# Patient Record
Sex: Female | Born: 1950 | Race: White | Hispanic: No | Smoking: Heavy tobacco smoker
Health system: Southern US, Community
[De-identification: ages and names within clinical notes are randomized; demographics above are authoritative.]

## PROBLEM LIST (undated history)

## (undated) DIAGNOSIS — M549 Dorsalgia, unspecified: Secondary | ICD-10-CM

## (undated) DIAGNOSIS — I10 Essential (primary) hypertension: Secondary | ICD-10-CM

## (undated) DIAGNOSIS — C44311 Basal cell carcinoma of skin of nose: Secondary | ICD-10-CM

## (undated) DIAGNOSIS — I739 Peripheral vascular disease, unspecified: Secondary | ICD-10-CM

## (undated) DIAGNOSIS — I2510 Atherosclerotic heart disease of native coronary artery without angina pectoris: Secondary | ICD-10-CM

## (undated) DIAGNOSIS — E119 Type 2 diabetes mellitus without complications: Secondary | ICD-10-CM

## (undated) HISTORY — PX: OTHER SURGICAL HISTORY: SHX169

---

## 1999-05-31 HISTORY — PX: OTHER SURGICAL HISTORY: SHX169

## 2002-05-30 DIAGNOSIS — C44311 Basal cell carcinoma of skin of nose: Secondary | ICD-10-CM

## 2002-05-30 HISTORY — DX: Basal cell carcinoma of skin of nose: C44.311

## 2002-05-30 HISTORY — PX: OTHER SURGICAL HISTORY: SHX169

## 2016-12-27 ENCOUNTER — Encounter (HOSPITAL_COMMUNITY): Payer: Self-pay | Admitting: Emergency Medicine

## 2016-12-27 ENCOUNTER — Inpatient Hospital Stay (HOSPITAL_COMMUNITY)
Admission: EM | Admit: 2016-12-27 | Discharge: 2016-12-30 | DRG: 246 | Disposition: A | Discharge status: Home / Self Care | Payer: Medicaid Other | Attending: Internal Medicine | Admitting: Internal Medicine

## 2016-12-27 ENCOUNTER — Emergency Department (HOSPITAL_COMMUNITY): Payer: Medicaid Other

## 2016-12-27 DIAGNOSIS — Z951 Presence of aortocoronary bypass graft: Secondary | ICD-10-CM

## 2016-12-27 DIAGNOSIS — R0602 Shortness of breath: Secondary | ICD-10-CM | POA: Diagnosis present

## 2016-12-27 DIAGNOSIS — I10 Essential (primary) hypertension: Secondary | ICD-10-CM | POA: Diagnosis present

## 2016-12-27 DIAGNOSIS — I5021 Acute systolic (congestive) heart failure: Secondary | ICD-10-CM | POA: Diagnosis present

## 2016-12-27 DIAGNOSIS — I739 Peripheral vascular disease, unspecified: Secondary | ICD-10-CM | POA: Diagnosis not present

## 2016-12-27 DIAGNOSIS — I509 Heart failure, unspecified: Secondary | ICD-10-CM | POA: Diagnosis not present

## 2016-12-27 DIAGNOSIS — I2510 Atherosclerotic heart disease of native coronary artery without angina pectoris: Secondary | ICD-10-CM | POA: Diagnosis present

## 2016-12-27 DIAGNOSIS — W268XXA Contact with other sharp object(s), not elsewhere classified, initial encounter: Secondary | ICD-10-CM | POA: Diagnosis not present

## 2016-12-27 DIAGNOSIS — Z6829 Body mass index (BMI) 29.0-29.9, adult: Secondary | ICD-10-CM | POA: Diagnosis not present

## 2016-12-27 DIAGNOSIS — Z66 Do not resuscitate: Secondary | ICD-10-CM | POA: Diagnosis present

## 2016-12-27 DIAGNOSIS — E1151 Type 2 diabetes mellitus with diabetic peripheral angiopathy without gangrene: Secondary | ICD-10-CM | POA: Diagnosis present

## 2016-12-27 DIAGNOSIS — E785 Hyperlipidemia, unspecified: Secondary | ICD-10-CM | POA: Diagnosis present

## 2016-12-27 DIAGNOSIS — Z85828 Personal history of other malignant neoplasm of skin: Secondary | ICD-10-CM | POA: Diagnosis not present

## 2016-12-27 DIAGNOSIS — I255 Ischemic cardiomyopathy: Secondary | ICD-10-CM | POA: Diagnosis present

## 2016-12-27 DIAGNOSIS — I25810 Atherosclerosis of coronary artery bypass graft(s) without angina pectoris: Secondary | ICD-10-CM | POA: Diagnosis not present

## 2016-12-27 DIAGNOSIS — I5020 Unspecified systolic (congestive) heart failure: Secondary | ICD-10-CM | POA: Diagnosis not present

## 2016-12-27 DIAGNOSIS — Z955 Presence of coronary angioplasty implant and graft: Secondary | ICD-10-CM | POA: Diagnosis not present

## 2016-12-27 DIAGNOSIS — I214 Non-ST elevation (NSTEMI) myocardial infarction: Principal | ICD-10-CM | POA: Diagnosis present

## 2016-12-27 DIAGNOSIS — R748 Abnormal levels of other serum enzymes: Secondary | ICD-10-CM | POA: Diagnosis not present

## 2016-12-27 DIAGNOSIS — I340 Nonrheumatic mitral (valve) insufficiency: Secondary | ICD-10-CM | POA: Diagnosis not present

## 2016-12-27 DIAGNOSIS — E1169 Type 2 diabetes mellitus with other specified complication: Secondary | ICD-10-CM | POA: Diagnosis present

## 2016-12-27 DIAGNOSIS — Z888 Allergy status to other drugs, medicaments and biological substances status: Secondary | ICD-10-CM | POA: Diagnosis not present

## 2016-12-27 DIAGNOSIS — E669 Obesity, unspecified: Secondary | ICD-10-CM | POA: Diagnosis present

## 2016-12-27 DIAGNOSIS — I5023 Acute on chronic systolic (congestive) heart failure: Secondary | ICD-10-CM | POA: Diagnosis not present

## 2016-12-27 DIAGNOSIS — S61411A Laceration without foreign body of right hand, initial encounter: Secondary | ICD-10-CM | POA: Diagnosis present

## 2016-12-27 DIAGNOSIS — F17200 Nicotine dependence, unspecified, uncomplicated: Secondary | ICD-10-CM | POA: Diagnosis not present

## 2016-12-27 DIAGNOSIS — F17210 Nicotine dependence, cigarettes, uncomplicated: Secondary | ICD-10-CM | POA: Diagnosis present

## 2016-12-27 DIAGNOSIS — R778 Other specified abnormalities of plasma proteins: Secondary | ICD-10-CM

## 2016-12-27 DIAGNOSIS — I5022 Chronic systolic (congestive) heart failure: Secondary | ICD-10-CM | POA: Diagnosis not present

## 2016-12-27 DIAGNOSIS — I110 Hypertensive heart disease with heart failure: Secondary | ICD-10-CM | POA: Diagnosis present

## 2016-12-27 DIAGNOSIS — R7989 Other specified abnormal findings of blood chemistry: Secondary | ICD-10-CM

## 2016-12-27 DIAGNOSIS — Z23 Encounter for immunization: Secondary | ICD-10-CM | POA: Diagnosis not present

## 2016-12-27 DIAGNOSIS — R0600 Dyspnea, unspecified: Secondary | ICD-10-CM | POA: Diagnosis not present

## 2016-12-27 HISTORY — DX: Essential (primary) hypertension: I10

## 2016-12-27 HISTORY — DX: Peripheral vascular disease, unspecified: I73.9

## 2016-12-27 HISTORY — DX: Atherosclerotic heart disease of native coronary artery without angina pectoris: I25.10

## 2016-12-27 HISTORY — DX: Type 2 diabetes mellitus without complications: E11.9

## 2016-12-27 HISTORY — DX: Basal cell carcinoma of skin of nose: C44.311

## 2016-12-27 HISTORY — DX: Dorsalgia, unspecified: M54.9

## 2016-12-27 LAB — CBC WITH DIFFERENTIAL/PLATELET
BASOS ABS: 0 10*3/uL (ref 0.0–0.1)
Basophils Relative: 1 %
Eosinophils Absolute: 0.4 10*3/uL (ref 0.0–0.7)
Eosinophils Relative: 5 %
HEMATOCRIT: 38.5 % (ref 36.0–46.0)
Hemoglobin: 12.7 g/dL (ref 12.0–15.0)
LYMPHS PCT: 29 %
Lymphs Abs: 2.4 10*3/uL (ref 0.7–4.0)
MCH: 29.8 pg (ref 26.0–34.0)
MCHC: 33 g/dL (ref 30.0–36.0)
MCV: 90.4 fL (ref 78.0–100.0)
Monocytes Absolute: 0.7 10*3/uL (ref 0.1–1.0)
Monocytes Relative: 8 %
NEUTROS ABS: 4.7 10*3/uL (ref 1.7–7.7)
Neutrophils Relative %: 57 %
PLATELETS: 247 10*3/uL (ref 150–400)
RBC: 4.26 MIL/uL (ref 3.87–5.11)
RDW: 15.5 % (ref 11.5–15.5)
WBC: 8.2 10*3/uL (ref 4.0–10.5)

## 2016-12-27 LAB — TROPONIN I: Troponin I: 0.55 ng/mL (ref <0.03)

## 2016-12-27 LAB — COMPREHENSIVE METABOLIC PANEL
ALBUMIN: 3.4 g/dL — AB (ref 3.5–5.0)
ALT: 9 U/L — ABNORMAL LOW (ref 14–54)
ANION GAP: 9 (ref 5–15)
AST: 13 U/L — AB (ref 15–41)
Alkaline Phosphatase: 73 U/L (ref 38–126)
BUN: 15 mg/dL (ref 6–20)
CHLORIDE: 111 mmol/L (ref 101–111)
CO2: 20 mmol/L — ABNORMAL LOW (ref 22–32)
Calcium: 9.5 mg/dL (ref 8.9–10.3)
Creatinine, Ser: 0.76 mg/dL (ref 0.44–1.00)
GFR calc Af Amer: >60 mL/min (ref >60)
GFR calc non Af Amer: >60 mL/min (ref >60)
GLUCOSE: 134 mg/dL — AB (ref 65–99)
POTASSIUM: 3.9 mmol/L (ref 3.5–5.1)
Sodium: 140 mmol/L (ref 135–145)
Total Bilirubin: 0.5 mg/dL (ref 0.3–1.2)
Total Protein: 7.1 g/dL (ref 6.5–8.1)

## 2016-12-27 LAB — GLUCOSE, CAPILLARY: GLUCOSE-CAPILLARY: 151 mg/dL — AB (ref 65–99)

## 2016-12-27 LAB — TSH: TSH: 1.311 u[IU]/mL (ref 0.350–4.500)

## 2016-12-27 LAB — BRAIN NATRIURETIC PEPTIDE: B NATRIURETIC PEPTIDE 5: 1937 pg/mL — AB (ref 0.0–100.0)

## 2016-12-27 MED ORDER — LIDOCAINE HCL (PF) 1 % IJ SOLN
INTRAMUSCULAR | Status: AC
  Filled 2016-12-27: qty 5

## 2016-12-27 MED ORDER — SODIUM CHLORIDE 0.9% FLUSH
3.0000 mL | Freq: Two times a day (BID) | INTRAVENOUS | Status: DC
  Administered 2016-12-27 – 2016-12-30 (×5): 3 mL via INTRAVENOUS

## 2016-12-27 MED ORDER — FUROSEMIDE 10 MG/ML IJ SOLN: 20.0000 mg | Freq: Two times a day (BID) | INTRAMUSCULAR | Status: DC

## 2016-12-27 MED ORDER — ENOXAPARIN SODIUM 40 MG/0.4ML ~~LOC~~ SOLN
40.0000 mg | SUBCUTANEOUS | Status: DC
  Administered 2016-12-27: 40 mg via SUBCUTANEOUS
  Filled 2016-12-27: qty 0.4

## 2016-12-27 MED ORDER — ACETAMINOPHEN 325 MG PO TABS
650.0000 mg | ORAL_TABLET | ORAL | Status: DC | PRN
Start: 2016-12-27 — End: 2016-12-30
  Administered 2016-12-29: 650 mg via ORAL
  Filled 2016-12-27: qty 2

## 2016-12-27 MED ORDER — CLOPIDOGREL BISULFATE 75 MG PO TABS
75.0000 mg | ORAL_TABLET | Freq: Every day | ORAL | Status: DC
  Administered 2016-12-27 – 2016-12-28 (×2): 75 mg via ORAL
  Filled 2016-12-27 (×2): qty 1

## 2016-12-27 MED ORDER — TRAMADOL HCL 50 MG PO TABS: 50.0000 mg | ORAL_TABLET | Freq: Four times a day (QID) | ORAL | Status: DC | PRN

## 2016-12-27 MED ORDER — NICOTINE 14 MG/24HR TD PT24
14.0000 mg | MEDICATED_PATCH | Freq: Every day | TRANSDERMAL | Status: DC
  Administered 2016-12-27 – 2016-12-30 (×3): 14 mg via TRANSDERMAL
  Filled 2016-12-27 (×3): qty 1

## 2016-12-27 MED ORDER — SODIUM CHLORIDE 0.9 % IV SOLN: 250.0000 mL | INTRAVENOUS | Status: DC | PRN

## 2016-12-27 MED ORDER — ASPIRIN EC 81 MG PO TBEC
81.0000 mg | DELAYED_RELEASE_TABLET | Freq: Every day | ORAL | Status: DC
  Administered 2016-12-27 – 2016-12-28 (×2): 81 mg via ORAL
  Filled 2016-12-27 (×2): qty 1

## 2016-12-27 MED ORDER — CARVEDILOL 3.125 MG PO TABS
3.1250 mg | ORAL_TABLET | Freq: Two times a day (BID) | ORAL | Status: DC
  Administered 2016-12-28 – 2016-12-30 (×5): 3.125 mg via ORAL
  Filled 2016-12-27 (×5): qty 1

## 2016-12-27 MED ORDER — LISINOPRIL 5 MG PO TABS
5.0000 mg | ORAL_TABLET | Freq: Every day | ORAL | Status: DC
  Administered 2016-12-28 – 2016-12-30 (×2): 5 mg via ORAL
  Filled 2016-12-27 (×2): qty 1

## 2016-12-27 MED ORDER — SODIUM CHLORIDE 0.9% FLUSH: 3.0000 mL | INTRAVENOUS | Status: DC | PRN

## 2016-12-27 MED ORDER — ATORVASTATIN CALCIUM 40 MG PO TABS
40.0000 mg | ORAL_TABLET | Freq: Every day | ORAL | Status: DC
  Administered 2016-12-28 – 2016-12-29 (×2): 40 mg via ORAL
  Filled 2016-12-27 (×2): qty 1

## 2016-12-27 MED ORDER — INSULIN ASPART 100 UNIT/ML ~~LOC~~ SOLN
0.0000 [IU] | Freq: Three times a day (TID) | SUBCUTANEOUS | Status: DC
  Administered 2016-12-28: 2 [IU] via SUBCUTANEOUS
  Administered 2016-12-28 – 2016-12-29 (×2): 3 [IU] via SUBCUTANEOUS
  Administered 2016-12-29 – 2016-12-30 (×2): 2 [IU] via SUBCUTANEOUS

## 2016-12-27 MED ORDER — NITROGLYCERIN 2 % TD OINT
1.0000 [in_us] | TOPICAL_OINTMENT | Freq: Once | TRANSDERMAL | Status: AC
  Administered 2016-12-27: 1 [in_us] via TOPICAL
  Filled 2016-12-27: qty 1

## 2016-12-27 MED ORDER — ASPIRIN EC 81 MG PO TBEC
81.0000 mg | DELAYED_RELEASE_TABLET | Freq: Every day | ORAL | Status: DC
  Administered 2016-12-30: 81 mg via ORAL
  Filled 2016-12-27: qty 1

## 2016-12-27 MED ORDER — POVIDONE-IODINE 10 % EX SOLN
CUTANEOUS | Status: AC
  Filled 2016-12-27: qty 30

## 2016-12-27 MED ORDER — ONDANSETRON HCL 4 MG/2ML IJ SOLN: 4.0000 mg | Freq: Four times a day (QID) | INTRAMUSCULAR | Status: DC | PRN

## 2016-12-27 MED ORDER — INSULIN ASPART 100 UNIT/ML ~~LOC~~ SOLN: 0.0000 [IU] | Freq: Every day | SUBCUTANEOUS | Status: DC

## 2016-12-27 MED ORDER — TETANUS-DIPHTH-ACELL PERTUSSIS 5-2.5-18.5 LF-MCG/0.5 IM SUSP
0.5000 mL | Freq: Once | INTRAMUSCULAR | Status: AC
  Administered 2016-12-27: 0.5 mL via INTRAMUSCULAR
  Filled 2016-12-27: qty 0.5

## 2016-12-27 MED ORDER — ALBUTEROL SULFATE (2.5 MG/3ML) 0.083% IN NEBU
5.0000 mg | INHALATION_SOLUTION | Freq: Once | RESPIRATORY_TRACT | Status: AC
  Administered 2016-12-27: 5 mg via RESPIRATORY_TRACT
  Filled 2016-12-27: qty 6

## 2016-12-27 MED ORDER — FUROSEMIDE 10 MG/ML IJ SOLN
20.0000 mg | Freq: Once | INTRAMUSCULAR | Status: AC
  Administered 2016-12-27: 20 mg via INTRAVENOUS
  Filled 2016-12-27: qty 2

## 2016-12-27 NOTE — ED Notes (Signed)
Critical Value: troponin  Value 0.55  Dr Roderic Palau informed

## 2016-12-27 NOTE — ED Triage Notes (Signed)
Patient states she cut her right had when opening a can today. States that she has been short of breath for 2 weeks and has not been able to sleep.

## 2016-12-27 NOTE — ED Provider Notes (Signed)
[facility] DEPT Provider Note   CSN: [id number] Arrival date & time: 12/27/16  1811     History   Chief Complaint Chief Complaint  Patient presents with  . Laceration  . Shortness of Breath    HPI Tamara Carpenter is a 66 y.o. female.  Patient states that she's been getting short of breath for the last couple weeks worse when she lies down flat. She states she wakes up in the middle the night short of breath. Patient also cut her right hand accidentally on a can of beans.   The history is provided by the patient.  Shortness of Breath  This is a recurrent problem. The problem occurs frequently.The current episode started more than 1 week ago. The problem has not changed since onset.Pertinent negatives include no fever, no headaches, no cough, no chest pain, no abdominal pain and no rash. It is unknown what precipitated the problem. Risk factors include recent prolonged sitting. She has tried nothing for the symptoms. The treatment provided no relief. She has had prior hospitalizations. She has had prior ED visits. She has had no prior ICU admissions. Associated medical issues do not include asthma.    Past Medical History:  Diagnosis Date  . Diabetes mellitus without complication ([place])   . Hypertension     Patient Active Problem List   Diagnosis Date Noted  . CHF exacerbation ([place]) 12/27/2016    Past Surgical History:  Procedure Laterality Date  . cardiac bypass      OB History    No data available       Home Medications    Prior to Admission medications   Not on File    Family History No family history on file.  Social History Social History  Substance Use Topics  . Smoking status: Heavy Tobacco Smoker    Packs/day: 1.00    Types: Cigarettes  . Smokeless tobacco: Never Used  . Alcohol use No     Allergies   Nitrofuran derivatives   Review of Systems Review of Systems  Constitutional: Negative for appetite change, fatigue and fever.  HENT:  Negative for congestion, ear discharge and sinus pressure.   Eyes: Negative for discharge.  Respiratory: Positive for shortness of breath. Negative for cough.   Cardiovascular: Negative for chest pain.  Gastrointestinal: Negative for abdominal pain and diarrhea.  Genitourinary: Negative for frequency and hematuria.  Musculoskeletal: Negative for back pain.       Hand laceration  Skin: Negative for rash.  Neurological: Negative for seizures and headaches.  Psychiatric/Behavioral: Negative for hallucinations.     Physical Exam Updated Vital Signs BP 140/76 (BP Location: Left Arm)   Pulse 93   Temp 97.8 F (36.6 C) (Oral)   Resp (!) 25   Ht 5\' 7"  (1.702 m)   Wt 86.2 kg (190 lb)   SpO2 94%   BMI 29.76 kg/m   Physical Exam  Constitutional: She is oriented to person, place, and time. She appears well-developed.  HENT:  Head: Normocephalic.  Eyes: Conjunctivae and EOM are normal. No scleral icterus.  Neck: Neck supple. No thyromegaly present.  Cardiovascular: Normal rate and regular rhythm.  Exam reveals no gallop and no friction rub.   No murmur heard. Pulmonary/Chest: No stridor. She has no wheezes. She has no rales. She exhibits no tenderness.  Abdominal: She exhibits no distension. There is no tenderness. There is no rebound.  Musculoskeletal: Normal range of motion. She exhibits edema.  3 cm laceration to the thenar eminence.  Laceration superficial  Lymphadenopathy:    She has no cervical adenopathy.  Neurological: She is oriented to person, place, and time. She exhibits normal muscle tone. Coordination normal.  Skin: No rash noted. No erythema.  Psychiatric: She has a normal mood and affect. Her behavior is normal.     ED Treatments / Results  Labs (all labs ordered are listed, but only abnormal results are displayed) Labs Reviewed  COMPREHENSIVE METABOLIC PANEL - Abnormal; Notable for the following:       Result Value   CO2 20 (*)    Glucose, Bld 134 (*)     Albumin 3.4 (*)    AST 13 (*)    ALT 9 (*)    All other components within normal limits  BRAIN NATRIURETIC PEPTIDE - Abnormal; Notable for the following:    B Natriuretic Peptide 1,937.0 (*)    All other components within normal limits  TROPONIN I - Abnormal; Notable for the following:    Troponin I 0.55 (*)    All other components within normal limits  CBC WITH DIFFERENTIAL/PLATELET    EKG  EKG Interpretation  Date/Time:  Tuesday December 27 2016 19:06:28 EDT Ventricular Rate:  96 PR Interval:    QRS Duration: 124 QT Interval:  378 QTC Calculation: 478 R Axis:   133 Text Interpretation:  Sinus rhythm Right bundle branch block Anterior infarct, old Confirmed by Milton Ferguson [phone]) on 12/27/2016 8:21:35 PM       Radiology Dg Chest 2 View  Result Date: 12/27/2016 CLINICAL DATA:  Short of breath for 2 weeks.  Productive cough. EXAM: CHEST  2 VIEW COMPARISON:  None. FINDINGS: Moderate cardiomegaly. Vascular congestion. Central basilar interstitial edema. Small pleural effusions. Increased AP diameter of the chest with kyphosis. There are mild wedge compression deformities involving T6, T7, T8, and T9. There is osteopenia in these have a chronic appearance. Postoperative changes from CABG and sternotomy are noted. IMPRESSION: CHF. Multiple mid-level thoracic compression deformities as described. Electronically Signed   By: Marybelle Killings M.D.   On: 12/27/2016 19:35    Procedures .Marland KitchenLaceration Repair Date/Time: 12/27/2016 9:13 PM Performed by: Milton Ferguson Authorized by: Milton Ferguson   Comments:     Patient with 3 cm superficial laceration to right hand. Area was cleaned thoroughly with Betadine and sterile water. Lidocaine without epi was used to anesthetize the area. 5 sutures were used to close laceration. There were 5-0 sutures.  Patient tolerated procedure well.   (including critical care time)  Medications Ordered in ED Medications  lidocaine (PF) (XYLOCAINE) 1 %  injection (not administered)  povidone-iodine (BETADINE) 10 % external solution (not administered)  furosemide (LASIX) injection 20 mg (not administered)  Tdap (BOOSTRIX) injection 0.5 mL (not administered)  nitroGLYCERIN (NITROGLYN) 2 % ointment 1 inch (not administered)  albuterol (PROVENTIL) (2.5 MG/3ML) 0.083% nebulizer solution 5 mg (5 mg Nebulization Given 12/27/16 1904)     Initial Impression / Assessment and Plan / ED Course  I have reviewed the triage vital signs and the nursing notes.  Pertinent labs & imaging results that were available during my care of the patient were reviewed by me and considered in my medical decision making (see chart for details).     Patient with congestive heart failure she will be admitted and diuresed. Patient also has laceration to her hand that has been sutured the sutures need to be taken out in 10-14 days Final Clinical Impressions(s) / ED Diagnoses   Final diagnoses:  Systolic congestive heart  failure, unspecified HF chronicity ([place])    New Prescriptions New Prescriptions   No medications on file     Milton Ferguson, MD 12/27/16 2114

## 2016-12-27 NOTE — ED Notes (Signed)
Daughter: Tamara Carpenter  [phone]  Please call for any change or of any information is needed

## 2016-12-27 NOTE — ED Notes (Signed)
Report to Heather, RN.

## 2016-12-27 NOTE — H&P (Signed)
History and Physical    Tamara Carpenter EGB:[id number] DOB: 1950/11/14 DOA: 12/27/2016  PCP: [facility]:  Dossie Der - cardiology in [place] (transferring here) Patient coming from: [place] from [place], moved here in October and moving care over; home - lives with sister; [place]: sister  Chief Complaint: hand laceration and SOB  HPI: Tamara Carpenter is a 66 y.o. 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 1- Tonight, she cut her hand while she was cooking (laceration).  This was sutured in the ER and she was given a tetanus shot. 2- For the last 2 weeks, she has SOB.  Tries to go to sleep and wakes back up SOB.  Has difficulty going to sleep.  Occasional SOB with ambulation.  The only way she can sleep (and only for a couple of hours then) is to sit up.  She sleeps sitting upright on a love seat with a pillow behind her.  +PND.  +LE edema, but she thinks that is from her diabetes.  +cough - productive of grayish white phlegm.  Some wheezing.  No chest pain.  She denies h/o CHF.  Her last echo was about 2 years ago in [place] (no records available but they have been requested).  She moved here from [place] in October and has been to [facility] once in May, by her report.  She was taking an array of medications (FeSO4; Metformin 1000 BID; Atenolol 50 BID; Plavix 75; Pletal 100 BID; Quinapril 20 BID; Pravastatin 80 qhs; Levemir 29 units daily; NTG prn; Tramadol 1 mg q6h; and 81 mg ASA) prior to moving and currently is only taking a couple of medications.   ED Course: 3 cm right hand laceration sutured; CHF - diuresis and admission  Review of Systems: As per HPI; otherwise review of systems reviewed and negative.   Ambulatory Status:  Ambulates without assistance  Past Medical History:  Diagnosis Date    . Back pain   . 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 CAD (coronary artery disease)   . Diabetes mellitus without complication ([place])   . Hypertension   . PVD (peripheral vascular disease) ([place])    has stents in both legs    Past Surgical History:  Procedure Laterality Date  . cardiac bypass  2001   3v  . multiple toe amputations  2010-2017   3 toes on the right foot, 1 on the left  . nose removal  2004   basal cell carcinoma    Social History   Social History  . Marital status: Single    Spouse name: N/A  . Number of children: N/A  . Years of education: N/A   Occupational History  . disabled    Social History Main Topics  . Smoking status: Heavy Tobacco Smoker    Packs/day: 1.00    Years: 48.00    Types: Cigarettes  . Smokeless tobacco: Never Used  . Alcohol use No  . Drug use: No  . Sexual activity: Not on file   Other Topics Concern  . Not on file   Social History Narrative  . No narrative on file    Allergies  Allergen Reactions  . Nitroglycerin     NOTE: Patient states that she can take the tablets  but is not able to use the patches    Family History  Problem Relation Age of Onset  . CAD Mother 11    Prior to Admission medications   Not on File    Physical Exam: Vitals:   12/27/16 1904 12/27/16 1930 12/27/16 1952 12/27/16 2132  BP:  140/76 140/76 (!) 143/77  Pulse:  91 93 99  Resp:  (!) 22 (!) 25 (!) 22  Temp:      TempSrc:      SpO2: 96% 96% 94% 97%  Weight:      Height:         General:  Appears calm and comfortable and is NAD; she mostly sits upright but does not otherwise appear uncomfortable Eyes:  PERRL, EOMI, normal lids, iris ENT:  grossly normal hearing, lips & tongue, mmm.  The outer 2/3 of her nose has been surgically removed, exposing the nasal septum which appears to have been covered with skin. Neck:  no LAD, masses or thyromegaly Cardiovascular:  RRR, no m/r/g. 2+ LE edema.   Respiratory:  Mildly decreased air movement in the bases without obvious crackles. Normal respiratory effort. Abdomen:  soft, ntnd, NABS Skin:  no rash or induration seen on limited exam.  + stasis. Musculoskeletal:  grossly normal tone BUE/BLE, good ROM, no bony abnormality.  S/p multiple amputations on the right foot (great toe, another middle toe, and the pinkie) and s/p amputation of the great toe on the left. Psychiatric:  grossly normal mood and affect, speech fluent and appropriate, AOx3 Neurologic:  CN 2-12 grossly intact, moves all extremities in coordinated fashion, sensation intact  Labs on Admission: I have personally reviewed following labs and imaging studies  CBC:  Recent Labs Lab 12/27/16 1901  WBC 8.2  NEUTROABS 4.7  HGB 12.7  HCT 38.5  MCV 90.4  PLT 935   Basic Metabolic Panel:  Recent Labs Lab 12/27/16 1901  NA 140  K 3.9  CL 111  CO2 20*  GLUCOSE 134*  BUN 15  CREATININE 0.76  CALCIUM 9.5   GFR: Estimated Creatinine Clearance: 80.1 mL/min (by C-G formula based on SCr of 0.76 mg/dL). Liver Function Tests:  Recent Labs Lab 12/27/16 1901  AST 13*  ALT 9*  ALKPHOS 73  BILITOT 0.5  PROT 7.1  ALBUMIN 3.4*   No results for input(s): LIPASE, AMYLASE in the last 168 hours. No results for input(s): AMMONIA in the last 168 hours. Coagulation Profile: No results for input(s): INR, PROTIME in the last 168 hours. Cardiac Enzymes:  Recent Labs Lab 12/27/16 1901  TROPONINI 0.55*   BNP (last 3 results) No results for input(s): PROBNP in the last 8760 hours. HbA1C: No results for input(s): HGBA1C in the last 72 hours. CBG: No results for input(s): GLUCAP in the last 168 hours. Lipid Profile: No results for input(s): CHOL, HDL, LDLCALC, TRIG, CHOLHDL, LDLDIRECT in the last 72 hours. Thyroid Function Tests: No results for input(s): TSH, T4TOTAL, FREET4, T3FREE, THYROIDAB in the last 72 hours. Anemia Panel: No results for input(s): VITAMINB12,  FOLATE, FERRITIN, TIBC, IRON, RETICCTPCT in the last 72 hours. Urine analysis: No results found for: COLORURINE, APPEARANCEUR, LABSPEC, PHURINE, GLUCOSEU, HGBUR, BILIRUBINUR, KETONESUR, PROTEINUR, UROBILINOGEN, NITRITE, LEUKOCYTESUR  Creatinine Clearance: Estimated Creatinine Clearance: 80.1 mL/min (by C-G formula based on SCr of 0.76 mg/dL).  Sepsis Labs: @LABRCNTIP (procalcitonin:4,lacticidven:4) )No results found for this or any previous visit (from the past 240 hour(s)).   Radiological Exams on Admission: Dg Chest 2 View  Result Date:  12/27/2016 CLINICAL DATA:  Short of breath for 2 weeks.  Productive cough. EXAM: CHEST  2 VIEW COMPARISON:  None. FINDINGS: Moderate cardiomegaly. Vascular congestion. Central basilar interstitial edema. Small pleural effusions. Increased AP diameter of the chest with kyphosis. There are mild wedge compression deformities involving T6, T7, T8, and T9. There is osteopenia in these have a chronic appearance. Postoperative changes from CABG and sternotomy are noted. IMPRESSION: CHF. Multiple mid-level thoracic compression deformities as described. Electronically Signed   By: Marybelle Killings M.D.   On: 12/27/2016 19:35    EKG: Independently reviewed.  NSR with rate 96; RBBB, nonspecific ST changes with no evidence of acute ischemia (initial EKG with significant artifact and so repeated)  Assessment/Plan Principal Problem:   CHF exacerbation (HCC) Active Problems:   NSTEMI (non-ST elevated myocardial infarction) ([place])   Diabetes mellitus type 2 in obese (HCC)   Tobacco dependence   Essential hypertension   S/P CABG x 3   PVD (peripheral vascular disease) (HCC)   CHF exacerbation -Patient with known ASCVD presenting with worsening SOB, orthopnea, and PND -Normal WBC count, no fever; very low suspicion for infectious cause and will not give antibiotics at this time -Significantly elevated BNP 1937 -Initial EKG suggested possible ST elevation but there was  significant artifact; repeat EKG is not overly concerning for acute ACS (despite elevated troponin, see below) -With elevated BNP and abnl CXR, new-onset CHF seems most likely diagnosis -Will admit with telemetry - she appears to need to re-establish care and has a complicated history that may take a few days to stabilize -Will request echocardiogram  -Will continue ASA and Plavix -Will start Lisinopril 5 mg daily (BP is normal to high and so should support addition of medication) -She was previously taking Atenolol while in [place]; will change to Coreg 3.25 mg BID -CHF order set utilized; may need CHF team consult but will hold until Echo results are available -Cardiology consult in AM -Was given Lasix 20 mg x 1 in ER and will repeat with 20 mg BID -She is not hypoxic but given her nasal resection, [place] O2 would not be an option for her -Normal kidney function at this time, will follow -Repeat EKG in AM  NSTEMI -Patient is without chest pain -She is in heart failure -Troponin 0.55 -Suspect that this is not a traditional MI associated with plaque rupture but rather her elevated troponin is secondary to demand ischemia in the setting of prolonged subacute heart failure -Will r/o with serial troponins although doubt ACS based on symptoms -If her troponin continues to rise and/or she develops chest pain, she will need overnight cardiology evaluation and initiation of treatment-dose Lovenox or heparin -If her troponin is flat overnight, it is reasonable to continue ASA and Plavix and await cardiology evaluation in the AM  S/p CABG, PVD with stents -She reports that 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 and so she would be unable to be resuscitated (and she wouldn't desire heroic measures anyway) -She also reports having stents in her legs, but additional information is not available at this time -She was previously on Pletal but does not appear to be taking this now -She is on ASA  and Plavix -She also is taking Pravastatin 80mg ; will substitute Lipitor 40 mg qhs for now and check FLP  DM -Glucose 134 -Hold Metformin -She was previously on Levemir but does not list this as a current home medication -Will cover with moderate-scale SSI for now -Check A1c -  She has a h/o multiple amputations, indicating long-standing suboptimal control  HTN -Currently does not appear to be on medications -Starting both low-dose ACE and Coreg now -Will follow  Tobacco dependence -Encourage cessation.  This was discussed with the patient and should be reviewed on an ongoing basis.   -She is pre-contemplative. -Patch ordered at patient request.   DVT prophylaxis:   Lovenox  Code Status: DNR - confirmed with patient Family Communication: None present Disposition Plan:  Home once clinically improved Consults called: Cardiology; CM/PT/OT/Nutrition  Admission status: Admit - It is my clinical opinion that admission to INPATIENT is reasonable and necessary because this patient will require at least 2 midnights in the hospital to treat this condition based on the medical complexity of the problems presented.  Given the aforementioned information, the predictability of an adverse outcome is felt to be significant.    Karmen Bongo MD [facility]  If 7PM-7AM, please contact night-coverage [email or web address] Password [facility]  12/27/2016, 9:52 PM

## 2016-12-27 NOTE — ED Notes (Signed)
Out of bed to BR 

## 2016-12-27 NOTE — ED Notes (Signed)
Dr Lorin Mercy has completed her eval-   Pt out of bed to BR

## 2016-12-27 NOTE — ED Notes (Addendum)
Bed is assigned

## 2016-12-27 NOTE — ED Notes (Signed)
From radiology 

## 2016-12-28 ENCOUNTER — Encounter (HOSPITAL_COMMUNITY): Payer: Self-pay

## 2016-12-28 ENCOUNTER — Inpatient Hospital Stay (HOSPITAL_COMMUNITY): Payer: Medicaid Other

## 2016-12-28 DIAGNOSIS — I509 Heart failure, unspecified: Secondary | ICD-10-CM

## 2016-12-28 DIAGNOSIS — R778 Other specified abnormalities of plasma proteins: Secondary | ICD-10-CM

## 2016-12-28 DIAGNOSIS — I340 Nonrheumatic mitral (valve) insufficiency: Secondary | ICD-10-CM

## 2016-12-28 DIAGNOSIS — R7989 Other specified abnormal findings of blood chemistry: Secondary | ICD-10-CM

## 2016-12-28 DIAGNOSIS — R0600 Dyspnea, unspecified: Secondary | ICD-10-CM

## 2016-12-28 DIAGNOSIS — R748 Abnormal levels of other serum enzymes: Secondary | ICD-10-CM

## 2016-12-28 LAB — ECHOCARDIOGRAM LIMITED
Height: 67 in
Weight: 3048 oz

## 2016-12-28 LAB — ECHOCARDIOGRAM COMPLETE
AVLVOTPG: 1 mmHg
CHL CUP MV DEC (S): 130
E decel time: 130 msec
E/e' ratio: 17.05
FS: 15 % — AB (ref 28–44)
Height: 67 in
IVS/LV PW RATIO, ED: 0.99
LA ID, A-P, ES: 45 mm
LA vol index: 50.4 mL/m2
LADIAMINDEX: 2.27 cm/m2
LAVOL: 99.7 mL
LAVOLA4C: 109 mL
LEFT ATRIUM END SYS DIAM: 45 mm
LV E/e' medial: 17.05
LV PW d: 15 mm — AB (ref 0.6–1.1)
LV SIMPSON'S DISK: 25
LV TDI E'MEDIAL: 4.7
LV dias vol: 160 mL — AB (ref 46–106)
LV e' LATERAL: 6.57 cm/s
LVDIAVOLIN: 81 mL/m2
LVEEAVG: 17.05
LVOT SV: 56 mL
LVOT VTI: 12.4 cm
LVOT area: 4.52 cm2
LVOT peak vel: 60.2 cm/s
LVOTD: 24 mm
LVSYSVOL: 120 mL — AB
LVSYSVOLIN: 61 mL/m2
Lateral S' vel: 8.68 cm/s
MV Peak grad: 5 mmHg
MV pk A vel: 86.7 m/s
MVPKEVEL: 112 m/s
Stroke v: 40 ml
TAPSE: 15.5 mm
TDI e' lateral: 6.57
Weight: 3048 oz

## 2016-12-28 LAB — CBC WITH DIFFERENTIAL/PLATELET
BASOS ABS: 0 10*3/uL (ref 0.0–0.1)
BASOS PCT: 0 %
EOS ABS: 0.4 10*3/uL (ref 0.0–0.7)
Eosinophils Relative: 4 %
HEMATOCRIT: 39.6 % (ref 36.0–46.0)
HEMOGLOBIN: 13 g/dL (ref 12.0–15.0)
Lymphocytes Relative: 29 %
Lymphs Abs: 2.7 10*3/uL (ref 0.7–4.0)
MCH: 29.9 pg (ref 26.0–34.0)
MCHC: 32.8 g/dL (ref 30.0–36.0)
MCV: 91 fL (ref 78.0–100.0)
Monocytes Absolute: 0.8 10*3/uL (ref 0.1–1.0)
Monocytes Relative: 8 %
NEUTROS ABS: 5.6 10*3/uL (ref 1.7–7.7)
Neutrophils Relative %: 59 %
Platelets: 263 10*3/uL (ref 150–400)
RBC: 4.35 MIL/uL (ref 3.87–5.11)
RDW: 15.4 % (ref 11.5–15.5)
WBC: 9.5 10*3/uL (ref 4.0–10.5)

## 2016-12-28 LAB — BASIC METABOLIC PANEL
ANION GAP: 11 (ref 5–15)
BUN: 14 mg/dL (ref 6–20)
CHLORIDE: 107 mmol/L (ref 101–111)
CO2: 23 mmol/L (ref 22–32)
CREATININE: 0.86 mg/dL (ref 0.44–1.00)
Calcium: 9 mg/dL (ref 8.9–10.3)
Glucose, Bld: 174 mg/dL — ABNORMAL HIGH (ref 65–99)
Potassium: 3.7 mmol/L (ref 3.5–5.1)
SODIUM: 141 mmol/L (ref 135–145)

## 2016-12-28 LAB — TROPONIN I
Troponin I: 0.52 ng/mL (ref <0.03)
Troponin I: 0.54 ng/mL (ref <0.03)
Troponin I: 0.54 ng/mL (ref <0.03)

## 2016-12-28 LAB — LIPID PANEL
CHOLESTEROL: 181 mg/dL (ref 0–200)
HDL: 29 mg/dL — ABNORMAL LOW (ref >40)
LDL CALC: 105 mg/dL — AB (ref 0–99)
TRIGLYCERIDES: 237 mg/dL — AB (ref <150)
Total CHOL/HDL Ratio: 6.2 RATIO
VLDL: 47 mg/dL — ABNORMAL HIGH (ref 0–40)

## 2016-12-28 LAB — GLUCOSE, CAPILLARY
GLUCOSE-CAPILLARY: 174 mg/dL — AB (ref 65–99)
GLUCOSE-CAPILLARY: 140 mg/dL — AB (ref 65–99)
Glucose-Capillary: 101 mg/dL — ABNORMAL HIGH (ref 65–99)
Glucose-Capillary: 93 mg/dL (ref 65–99)

## 2016-12-28 LAB — PROTIME-INR
INR: 1.21
PROTHROMBIN TIME: 15.4 s — AB (ref 11.4–15.2)

## 2016-12-28 LAB — TSH: TSH: 1.353 u[IU]/mL (ref 0.350–4.500)

## 2016-12-28 LAB — HEPARIN LEVEL (UNFRACTIONATED): Heparin Unfractionated: 0.33 IU/mL (ref 0.30–0.70)

## 2016-12-28 MED ORDER — HEPARIN (PORCINE) IN NACL 100-0.45 UNIT/ML-% IJ SOLN
1000.0000 [IU]/h | INTRAMUSCULAR | Status: DC
  Administered 2016-12-28 – 2016-12-29 (×2): 1000 [IU]/h via INTRAVENOUS
  Filled 2016-12-28 (×2): qty 250

## 2016-12-28 MED ORDER — HEPARIN BOLUS VIA INFUSION
4000.0000 [IU] | Freq: Once | INTRAVENOUS | Status: AC
  Administered 2016-12-28: 4000 [IU] via INTRAVENOUS
  Filled 2016-12-28: qty 4000

## 2016-12-28 MED ORDER — POTASSIUM CHLORIDE CRYS ER 20 MEQ PO TBCR
20.0000 meq | EXTENDED_RELEASE_TABLET | Freq: Two times a day (BID) | ORAL | Status: DC
  Administered 2016-12-28 – 2016-12-30 (×4): 20 meq via ORAL
  Filled 2016-12-28 (×4): qty 1

## 2016-12-28 MED ORDER — SODIUM CHLORIDE 0.9% FLUSH: 3.0000 mL | Freq: Two times a day (BID) | INTRAVENOUS | Status: DC

## 2016-12-28 MED ORDER — PERFLUTREN LIPID MICROSPHERE
1.0000 mL | INTRAVENOUS | Status: AC | PRN
  Administered 2016-12-28: 2 mL via INTRAVENOUS
  Filled 2016-12-28: qty 10

## 2016-12-28 MED ORDER — FUROSEMIDE 10 MG/ML IJ SOLN
40.0000 mg | Freq: Two times a day (BID) | INTRAMUSCULAR | Status: DC
  Administered 2016-12-28 – 2016-12-30 (×4): 40 mg via INTRAVENOUS
  Filled 2016-12-28 (×4): qty 4

## 2016-12-28 MED ORDER — SODIUM CHLORIDE 0.9 % IV SOLN: 250.0000 mL | INTRAVENOUS | Status: DC | PRN

## 2016-12-28 MED ORDER — SODIUM CHLORIDE 0.9 % IV SOLN
INTRAVENOUS | Status: DC
  Administered 2016-12-29: 10 mL/h via INTRAVENOUS

## 2016-12-28 MED ORDER — ASPIRIN 81 MG PO CHEW
81.0000 mg | CHEWABLE_TABLET | ORAL | Status: AC
  Administered 2016-12-29: 81 mg via ORAL
  Filled 2016-12-28: qty 1

## 2016-12-28 MED ORDER — SODIUM CHLORIDE 0.9% FLUSH: 3.0000 mL | INTRAVENOUS | Status: DC | PRN

## 2016-12-28 NOTE — Progress Notes (Signed)
*  PRELIMINARY RESULTS* Echocardiogram 2D Echocardiogram limited with definity has been performed.  Leavy Cella 12/28/2016, 3:07 PM

## 2016-12-28 NOTE — Progress Notes (Addendum)
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  Her ECHO showed EF of 25-30 percent, and he was seen by cardiology and Tamara Carpenter felt that ICM needs to be r/out.   Assessment & Plan:   Principal Problem:   CHF exacerbation (Tamara Carpenter) Active Problems:   NSTEMI (non-ST elevated myocardial infarction) (Tamara Carpenter)   Diabetes mellitus type 2 in obese (Tamara Carpenter)   Tobacco dependence   Essential hypertension   S/P CABG x 3   PVD (peripheral vascular disease) ([place])   Acute heart failure (Tamara Carpenter)   Elevated troponin   1. CHF.  R/out ischemic CMP.  She has slight positve in the troponin, and was started on IV Heparin.  Per cardiology, she will be sent to Tamara Carpenter for cardiac cath.  Arrangement will be per cardiollogy.   2. Laceraton:  She was given dT, and had sutures.  Note she is on blood thinner. 3. DM:  Metformin was held.  She was given SSI. 4. Tobacco abuse:  Advised stop.    DVT prophylaxis:  IV Heparin. Code Status:  DNR confirmed per admitting physician.  Family Communication: None at bedside.   Consultants:   Cardiology.   Procedures:  None.  Antimicrobials: Anti-infectives    None       Subjective:  No complaints.     Objective: Vitals:   12/27/16 1952 12/27/16 2132 12/27/16 2247 12/28/16 0500  BP: 140/76 (!) 143/77 133/73   Pulse: 93 99 91   Resp: (!) 25 (!) 22 20   Temp:   97.8 F (36.6 C)   TempSrc:   Oral   SpO2: 94% 97% 98%   Weight:   86.7 kg (191 lb 1.6 oz) 86.4 kg (190 lb 8 oz)  Height:   5\' 7"  (1.702 m)     Intake/Output Summary (Last 24 hours) at 12/28/16  1451 Last data filed at 12/28/16 0500  Gross per 24 hour  Intake              225 ml  Output              900 ml  Net             -675 ml   Filed Weights   12/27/16 1833 12/27/16 2247 12/28/16 0500  Weight: 86.2 kg (190 lb) 86.7 kg (191 lb 1.6 oz) 86.4 kg (190 lb 8 oz)    Examination:  General exam: Appears calm and comfortable  Respiratory system: Clear to auscultation. Respiratory effort normal. Cardiovascular system: S1 & S2 heard, RRR. No JVD, murmurs, rubs, gallops or clicks. No pedal edema. Gastrointestinal system: Abdomen is nondistended, soft and nontender. No organomegaly or masses felt. Normal bowel sounds heard. Central nervous system: Alert and oriented. No focal neurological deficits. Extremities: Symmetric 5 x 5 power. Skin: No rashes, lesions or ulcers Psychiatry: Judgement and insight appear normal. Mood & affect appropriate.   Data Reviewed: I have personally reviewed following labs and imaging studies  CBC:  Recent Labs Lab 12/27/16 1901 12/27/16 2351  WBC 8.2 9.5  NEUTROABS 4.7 5.6  HGB 12.7 13.0  HCT 38.5 39.6  MCV 90.4 91.0  PLT 247 885   Basic Metabolic Panel:  Recent Labs Lab 12/27/16 1901 12/28/16 0629  NA 140 141  K 3.9 3.7  CL 111 107  CO2 20* 23  GLUCOSE 134* 174*  BUN 15 14  CREATININE 0.76 0.86  CALCIUM 9.5 9.0   GFR: Estimated Creatinine Clearance: 74.6 mL/min (by C-G formula based on SCr of 0.86 mg/dL). Liver Function Tests:  Recent Labs Lab 12/27/16 1901  AST 13*  ALT 9*  ALKPHOS 73  BILITOT 0.5  PROT 7.1  ALBUMIN 3.4*   Coagulation Profile:  Recent Labs Lab 12/28/16 1204  INR 1.21   Cardiac Enzymes:  Recent Labs Lab 12/27/16 1901 12/27/16 2351 12/28/16 0632 12/28/16 1204  TROPONINI 0.55* 0.52* 0.54* 0.54*   CBG:  Recent Labs Lab 12/27/16 2243 12/28/16 0753 12/28/16 1202  GLUCAP 151* 174* 101*   Lipid Profile:  Recent Labs  12/28/16 0632  CHOL 181  HDL 29*  LDLCALC 105*  TRIG 237*    CHOLHDL 6.2   Thyroid Function Tests:  Recent Labs  12/28/16 1231  TSH 1.353   Radiology Studies: Dg Chest 2 View  Result Date: 12/27/2016 CLINICAL DATA:  Short of breath for 2 weeks.  Productive cough. EXAM: CHEST  2 VIEW COMPARISON:  None. FINDINGS: Moderate cardiomegaly. Vascular congestion. Central basilar interstitial edema. Small pleural effusions. Increased AP diameter of the chest with kyphosis. There are mild wedge compression deformities involving T6, T7, T8, and T9. There is osteopenia in these have a chronic appearance. Postoperative changes from CABG and sternotomy are noted. IMPRESSION: CHF. Multiple mid-level thoracic compression deformities as described. Electronically Signed   By: Marybelle Killings M.D.   On: 12/27/2016 19:35    Scheduled Meds: . aspirin EC  81 mg Oral Daily  . aspirin EC  81 mg Oral Daily  . atorvastatin  40 mg Oral q1800  . carvedilol  3.125 mg Oral BID WC  . clopidogrel  75 mg Oral Daily  . furosemide  40 mg Intravenous Q12H  . insulin aspart  0-15 Units Subcutaneous TID WC  . insulin aspart  0-5 Units Subcutaneous QHS  . lisinopril  5 mg Oral Daily  . nicotine  14 mg Transdermal Daily  . potassium chloride  20 mEq Oral BID  . sodium chloride flush  3 mL Intravenous Q12H   Continuous Infusions: . sodium chloride    . heparin 1,000 Units/hr (12/28/16 1047)    LOS: 1 day   Arie Gable, MD FACP Hospitalist.  If 7PM-7AM, please contact night-coverage [email or web address] Password [email or web address] 12/28/2016, 2:51 PM

## 2016-12-28 NOTE — Progress Notes (Signed)
Pt states that she has periods of difficulty swallowing that has been occurring since her bypass surgery. MD made aware. Will continue to monitor.

## 2016-12-28 NOTE — Progress Notes (Signed)
CRITICAL VALUE ALERT  Critical Value: Troponin 0.52  Date & Time Notied:  12/28/16, 0100  Provider Notified: [place]  Orders Received/Actions taken: No new orders

## 2016-12-28 NOTE — Final Consult Note (Addendum)
Cardiology Consultation:   Patient ID: Jiovanna Frei; [id number]; Jun 19, 1950   Admit date: 12/27/2016 Date of Consult: 12/28/2016  Primary Care Provider: [facility] Primary Cardiologist:[place] but moved here and transferring care Primary Electrophysiologist:  none   Patient Profile:   Meital Riehl is a 66 y.o. female with a hx of CABG who is being seen today for the evaluation of CHF at the request of Dr. Lorin Mercy  History of Present Illness:   Ms. Bond is a 67 year old female patient with history of CAD status post MI and CABG 2001, hypertension, diabetes mellitus status post multiple toe amputations, PVD with leg stents 2013 and basal cell CA of the nose status post resection 2004. She has been followed in [place] for many years but recently moved to [place] for 9 months and now here living with her sister. She'd like to transfer care here. She had been out of all her meds for over 1 yr but was restarted on 4 of them in May by primary care. Hasn't seen cardiologist in over 2 yrs.  She came to the emergency room after cutting her hand while cooking that required suturing. She also complained of shortness of breath with orthopnea for couple weeks. She has occasional angina which she takes NTG for but only once every 6 months. Some leg edema.Smokes 10-12 cigs/day. Doesn't have history of CHF. She was found to be in CHF with a BNP over 1900. Troponins were elevated but flat at 0.55. EKG without acute ischemic changes.    Past Medical History:  Diagnosis Date  . Back pain   . 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 CAD (coronary artery disease)   . Diabetes mellitus without complication ([place])   . Hypertension   . PVD (peripheral vascular disease) ([place])    has stents in both legs    Past Surgical History:  Procedure Laterality Date  . cardiac bypass  2001   3v  . multiple toe amputations  2010-2017   3 toes on the  right foot, 1 on the left  . nose removal  2004   basal cell carcinoma     Inpatient Medications: Scheduled Meds: . aspirin EC  81 mg Oral Daily  . aspirin EC  81 mg Oral Daily  . atorvastatin  40 mg Oral q1800  . carvedilol  3.125 mg Oral BID WC  . clopidogrel  75 mg Oral Daily  . enoxaparin (LOVENOX) injection  40 mg Subcutaneous Q24H  . furosemide  20 mg Intravenous Q12H  . insulin aspart  0-15 Units Subcutaneous TID WC  . insulin aspart  0-5 Units Subcutaneous QHS  . lidocaine (PF)      . lisinopril  5 mg Oral Daily  . nicotine  14 mg Transdermal Daily  . povidone-iodine      . sodium chloride flush  3 mL Intravenous Q12H   Continuous Infusions: . sodium chloride     PRN Meds: sodium chloride, acetaminophen, ondansetron (ZOFRAN) IV, sodium chloride flush, traMADol  Allergies:    Allergies  Allergen Reactions  . Nitroglycerin     NOTE: Patient states that she can take the tablets but is not able to use the patches    Social History:   Social History   Social History  . Marital status: Single    Spouse name: N/A  . Number of children: N/A  . Years of education: N/A   Occupational  History  . disabled    Social History Main Topics  . Smoking status: Heavy Tobacco Smoker    Packs/day: 1.00    Years: 48.00    Types: Cigarettes  . Smokeless tobacco: Never Used  . Alcohol use No  . Drug use: No  . Sexual activity: Not on file   Other Topics Concern  . Not on file   Social History Narrative  . No narrative on file    Family History:   The patient's family history includes CAD (age of onset: 103) in her mother.  ROS:  Please see the history of present illness.  Review of Systems  Constitution: Positive for weakness.  HENT: Negative.   Eyes: Negative.   Cardiovascular: Positive for chest pain, dyspnea on exertion, leg swelling, orthopnea and paroxysmal nocturnal dyspnea.  Respiratory: Positive for shortness of breath, sleep disturbances due to  breathing and wheezing.   Hematologic/Lymphatic: Negative.   Skin: Positive for skin cancer.  Musculoskeletal: Positive for muscle weakness and myalgias. Negative for joint pain.  Gastrointestinal: Negative.   Genitourinary: Negative.      All other ROS reviewed and negative.     Physical Exam/Data:   Vitals:   12/27/16 1952 12/27/16 2132 12/27/16 2247 12/28/16 0500  BP: 140/76 (!) 143/77 133/73   Pulse: 93 99 91   Resp: (!) 25 (!) 22 20   Temp:   97.8 F (36.6 C)   TempSrc:   Oral   SpO2: 94% 97% 98%   Weight:   191 lb 1.6 oz (86.7 kg) 190 lb 8 oz (86.4 kg)  Height:   5\' 7"  (1.702 m)     Intake/Output Summary (Last 24 hours) at 12/28/16 0756 Last data filed at 12/28/16 0500  Gross per 24 hour  Intake              225 ml  Output              900 ml  Net             -675 ml   Filed Weights   12/27/16 1833 12/27/16 2247 12/28/16 0500  Weight: 190 lb (86.2 kg) 191 lb 1.6 oz (86.7 kg) 190 lb 8 oz (86.4 kg)   Body mass index is 29.84 kg/m.  General:  Well nourished, well developed, in no acute distress HEENT: most of nose removed for CA Lymph: no adenopathy Neck: no JVD Endocrine:  No thryomegaly Vascular: No carotid bruits; FA pulsesdecreased bilaterally without bruits  Cardiac:  normal S1, S2; RRR; S4, 1-2/6 sys murmur apex-distant HS Lungs:  Decreased breath sounds with diffuse wheezing, rhonchi and rales bilateral bases  Abd: soft, nontender, no hepatomegaly  Ext: several toe amputations with 1 plus edema Musculoskeletal: toe amputations BUE and BLE strength normal and equal Skin: warm and dry  Neuro:  CNs 2-12 intact, no focal abnormalities noted Psych:  Normal affect   EKG:  The EKG was personally reviewed and demonstrates:  Normal sinus rhythm with 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 Telemetry:  Telemetry was personally reviewed and demonstrates:  NSR  Relevant CV Studies:   Laboratory Data:  Chemistry Recent Labs Lab 12/27/16 1901  12/28/16 0629  NA 140 141  K 3.9 3.7  CL 111 107  CO2 20* 23  GLUCOSE 134* 174*  BUN 15 14  CREATININE 0.76 0.86  CALCIUM 9.5 9.0  GFRNONAA >60 >60  GFRAA >60 >60  ANIONGAP 9 11     Recent Labs  Lab 12/27/16 1901  PROT 7.1  ALBUMIN 3.4*  AST 13*  ALT 9*  ALKPHOS 73  BILITOT 0.5   Hematology Recent Labs Lab 12/27/16 1901 12/27/16 2351  WBC 8.2 9.5  RBC 4.26 4.35  HGB 12.7 13.0  HCT 38.5 39.6  MCV 90.4 91.0  MCH 29.8 29.9  MCHC 33.0 32.8  RDW 15.5 15.4  PLT 247 263   Cardiac Enzymes Recent Labs Lab 12/27/16 1901 12/27/16 2351 12/28/16 0632  TROPONINI 0.55* 0.52* 0.54*   No results for input(s): TROPIPOC in the last 168 hours.  BNP Recent Labs Lab 12/27/16 1908  BNP 1,937.0*    DDimer No results for input(s): DDIMER in the last 168 hours.  Radiology/Studies:  Dg Chest 2 View  Result Date: 12/27/2016 CLINICAL DATA:  Short of breath for 2 weeks.  Productive cough. EXAM: CHEST  2 VIEW COMPARISON:  None. FINDINGS: Moderate cardiomegaly. Vascular congestion. Central basilar interstitial edema. Small pleural effusions. Increased AP diameter of the chest with kyphosis. There are mild wedge compression deformities involving T6, T7, T8, and T9. There is osteopenia in these have a chronic appearance. Postoperative changes from CABG and sternotomy are noted. IMPRESSION: CHF. Multiple mid-level thoracic compression deformities as described. Electronically Signed   By: Marybelle Killings M.D.   On: 12/27/2016 19:35    Assessment and Plan:   Acute CHF question systolic versus diastolic BNP 4967. Records requested from [facility] and echo here pending. Out of meds for over 1 yr, 4 restarted in May-Pravastatin, Plavix, metformin and ultram. On Lasix 20 mg IV BID. Will increase to 40 mg IV BID.Add potassium. Normal renal function. Follow. Negative 675 cc overnight.  CAD status post MI & CABG 2001-some angina but overall stable- EKG old ant MI, Troponins flat  PVD status post  leg stents [facility] 2013  Hypertension coreg and lisinopril added  Diabetes mellitus on metformin as outpatient-insulin here.   Sumner Boast, PA-C  12/28/2016 7:56 AM   Attending Note Patient seen and discussed with PA [place] Public, I agree with her documentation above. 66 yo female with prior care in [place], records are not available at this time. She reportedly has a remote history of prior CABG, HTN, DM2, PAD with prior interventions admitted with SOB, DOE, and orthopnea.    Hgb 12.7, Plt 247, K 3.9, Cr 0.76, TSH 1.3, BNP 1937,  Trop 0.55-->0.52-->0.54 CXR pulmonary edema EKG SR, RBBB, RPFB, inferior and lateral TWIs  66 yo female presents with signs and symptoms of CHF. Previously followed by Dr Charlett Nose in [place], [place]. Little is known about her prior cardiac history, including prior LV function. I have placed an order to request records She reports a CABG in 2001, denies any cardiac cardiac stents or caths since that time. She is unaware of any prior history of CHF. Reports several week history of progressiong SOB, DOE, and orthopnea. Has noted some LE edema. No chest pain. She reports her breathing has never been this bad  Patient presents with signs and symptoms of acute congestive heart failure. Prior cardiac function is unknown at this time, echo is pending today. Her troponin though fairly flat is fairly elevated to attribute to CHF alone, concern would be ischemia as possible cause of her acute CHF. Medical therapy with aspirin, atorva 40, coreg 3.125mg  bd, plavix 75, lisinopril 5. We will start heparin gtt. I have asked our echo technicians to perform her echo this morning. I would anticipate likely transfer for LHC/RHC pending echo results. Recent hand  laceration that received stitches, will need to monitor while on heparin. Continue IV lasix.    Carlyle Dolly MD  Addendum 1230pm Echo shows LVEF 25-30%, this is some limitation in visualization and I think her LVEF and  wall motion could better be described with limited contrast study. She also has restrictivie diastolic function. Unknown prior cardiac function but she denies any known history of heart failure and has not had a cath since her 2001 CABG. Drop in LVEF and elevated troponin concerning for ICM. We will plan for transfer to [facility], transfer today and procedure for tomorrow. Continue current medical therapy   Carlyle Dolly MD

## 2016-12-28 NOTE — Progress Notes (Signed)
Report called and given to RN on [place].

## 2016-12-28 NOTE — Progress Notes (Signed)
OT Screen Note  Patient Details Name: Tamara Carpenter MRN: [id number] DOB: 1951/02/23   Cancelled Treatment:    Reason Eval/Treat Not Completed: OT screened, no needs identified, will sign off. Pt is currently functioning at Independent level and is safe to return to home when medically ready without any additional OT services.     Ailene Ravel, OTR/L,CBIS  [phone] 12/28/2016, 8:51 AM

## 2016-12-28 NOTE — Progress Notes (Signed)
EKG completed and placed on pts chart.  

## 2016-12-28 NOTE — Progress Notes (Signed)
*  PRELIMINARY RESULTS* Echocardiogram 2D Echocardiogram has been performed.  Samuel Germany 12/28/2016, 12:01 PM

## 2016-12-28 NOTE — Plan of Care (Signed)
Problem: Activity: Goal: Capacity to carry out activities will improve Outcome: Progressing Verbalized understanding  Problem: Cardiac: Goal: Ability to achieve and maintain adequate cardiopulmonary perfusion will improve Outcome: Progressing Verbalized understanding  Problem: Education: Goal: Ability to demonstrate managment of disease process will improve Outcome: Progressing Verbalized understanding Goal: Ability to verbalize understanding of medication therapies will improve Outcome: Progressing Verbalized understanding  Problem: Health Behavior/Discharge Planning: Goal: Ability to manage health-related needs will improve for discharge Outcome: Progressing Verbalized understanding

## 2016-12-28 NOTE — Progress Notes (Signed)
PT Cancellation Note  Patient Details Name: Tamara Carpenter MRN: [id number] DOB: 1950-06-11   Cancelled Treatment:    Reason Eval/Treat Not Completed: Patient not medically ready. Chart reviewed, RN consulted. Per cardiology note:   "Drop in LVEF and elevated troponin concerning for ICM. We will plan for transfer to [facility], transfer today and procedure for tomorrow."  I will DC PT order at this time and await new order once pt returns from [facility] and is medical ready for OOB assessment.  1:01 PM, 12/28/16 Etta Grandchild, PT, DPT Physical Therapist - [place] [phone] [facility])  [phone] (Office)    Jerral Mccauley C 12/28/2016, 12:59 PM

## 2016-12-28 NOTE — Plan of Care (Signed)
Problem: Safety: Goal: Ability to remain free from injury will improve Outcome: Progressing Verbalized understanding

## 2016-12-28 NOTE — Progress Notes (Signed)
Nutrition Brief Note  RD consulted due to dx of HF and to screen patient for any diet related questions.   Pt reports that she only eats 1 meal day. She tries to make all her meals "from scratch". However, her meals sound to be very light such as a bowl of cereal of a PBJ sandwich. She sounds to eat relatively little.   Denies eating fast food, frozen meals, hotdogs, lunch meats or canned soup with any consistency.   She does not use the salt shaker and uses Mrs. Dash salt substitute.   She says she had recently spoken to a dietician at [facility] regarding a low sodium diet.   No diet/education needs detected.   Burtis Junes RD, LDN, CNSC Clinical Nutrition Pager: [phone] 12/28/2016 12:07 PM

## 2016-12-28 NOTE — Progress Notes (Addendum)
ANTICOAGULATION CONSULT NOTE - Initial Consult  Pharmacy Consult for Heparin Indication: chest pain/ACS  Allergies  Allergen Reactions  . Nitroglycerin     NOTE: Patient states that she can take the tablets but is not able to use the patches    Patient Measurements: Height: 5\' 7"  (170.2 cm) Weight: 190 lb 8 oz (86.4 kg) IBW/kg (Calculated) : 61.6 HEPARIN DW (KG): 79.9  Vital Signs: Temp: 97.8 F (36.6 C) (07/31 2247) Temp Source: Oral (07/31 2247) BP: 133/73 (07/31 2247) Pulse Rate: 91 (07/31 2247)  Labs:  Recent Labs  12/27/16 1901 12/27/16 2351 12/28/16 0629 12/28/16 0632  HGB 12.7 13.0  --   --   HCT 38.5 39.6  --   --   PLT 247 263  --   --   CREATININE 0.76  --  0.86  --   TROPONINI 0.55* 0.52*  --  0.54*    Estimated Creatinine Clearance: 74.6 mL/min (by C-G formula based on SCr of 0.86 mg/dL).   Medical History: Past Medical History:  Diagnosis Date  . Back pain   . 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 CAD (coronary artery disease)   . Diabetes mellitus without complication ([place])   . Hypertension   . PVD (peripheral vascular disease) ([place])    has stents in both legs    Medications:  Prescriptions Prior to Admission  Medication Sig Dispense Refill Last Dose  . aspirin EC 81 MG tablet Take 81 mg by mouth daily.   12/27/2016 at Unknown time  . Cholecalciferol (VITAMIN D3) 5000 units CAPS Take 1 capsule by mouth daily.   12/27/2016 at Unknown time  . clopidogrel (PLAVIX) 75 MG tablet Take 75 mg by mouth daily.   12/27/2016 at Unknown time  . metFORMIN (GLUCOPHAGE) 1000 MG tablet Take 1,000 mg by mouth 2 (two) times daily with a meal.   12/27/2016 at Unknown time  . nitroGLYCERIN (NITROSTAT) 0.4 MG SL tablet Place 0.4 mg under the tongue every 5 (five) minutes as needed for chest pain.   UNKNOWN  . pravastatin (PRAVACHOL) 80 MG tablet Take 80 mg by mouth at bedtime.   12/26/2016 at Unknown time  . traMADol (ULTRAM) 50 MG  tablet Take 50 mg by mouth every 6 (six) hours as needed for moderate pain or severe pain.   UNKNOWN at Unknown time    Assessment: 66 yo female presents with signs and symptoms of CHF. Little is known about her prior cardiac history. . Her troponin is fairly elevated to attribute to CHF alone, concern would be ischemia as the etiology of her CHF. Medical therapy with aspirin, atorva 40, coreg 3.125mg  bd, plavix 75, lisinopril 5. Plan is to start heparin infusion.  Goal of Therapy:  Heparin level 0.3-0.7 units/ml Monitor platelets by anticoagulation protocol: Yes   Plan:  Give 4000 units bolus x 1 Start heparin infusion at 1000 units/hr Check anti-Xa level in 6 hours and daily while on heparin Continue to monitor H&H and platelets  Isac Sarna, BS Vena [place], BCPS Clinical Pharmacist Pager [phone] 12/28/2016,10:24 AM  Addum:  Initial heparin level therapeutic.  Cont drip at 1000 units/hr.  F/u am labs

## 2016-12-29 ENCOUNTER — Encounter (HOSPITAL_COMMUNITY)
Admission: EM | Disposition: A | Discharge status: Home / Self Care | Payer: Self-pay | Source: Home / Self Care | Attending: Internal Medicine

## 2016-12-29 DIAGNOSIS — I5023 Acute on chronic systolic (congestive) heart failure: Secondary | ICD-10-CM

## 2016-12-29 DIAGNOSIS — E669 Obesity, unspecified: Secondary | ICD-10-CM

## 2016-12-29 DIAGNOSIS — I25810 Atherosclerosis of coronary artery bypass graft(s) without angina pectoris: Secondary | ICD-10-CM

## 2016-12-29 DIAGNOSIS — E1169 Type 2 diabetes mellitus with other specified complication: Secondary | ICD-10-CM

## 2016-12-29 DIAGNOSIS — I5021 Acute systolic (congestive) heart failure: Secondary | ICD-10-CM

## 2016-12-29 HISTORY — PX: CORONARY STENT INTERVENTION: CATH118234

## 2016-12-29 HISTORY — PX: RIGHT/LEFT HEART CATH AND CORONARY/GRAFT ANGIOGRAPHY: CATH118267

## 2016-12-29 LAB — GLUCOSE, CAPILLARY
GLUCOSE-CAPILLARY: 141 mg/dL — AB (ref 65–99)
GLUCOSE-CAPILLARY: 198 mg/dL — AB (ref 65–99)
Glucose-Capillary: 190 mg/dL — ABNORMAL HIGH (ref 65–99)
Glucose-Capillary: 170 mg/dL — ABNORMAL HIGH (ref 65–99)

## 2016-12-29 LAB — HEMOGLOBIN A1C
Hgb A1c MFr Bld: 7.2 % — ABNORMAL HIGH (ref 4.8–5.6)
MEAN PLASMA GLUCOSE: 160 mg/dL

## 2016-12-29 LAB — HIV ANTIBODY (ROUTINE TESTING W REFLEX): HIV SCREEN 4TH GENERATION: NONREACTIVE

## 2016-12-29 SURGERY — RIGHT/LEFT HEART CATH AND CORONARY/GRAFT ANGIOGRAPHY
Anesthesia: LOCAL

## 2016-12-29 MED ORDER — IOPAMIDOL (ISOVUE-370) INJECTION 76%
INTRAVENOUS | Status: DC | PRN
  Administered 2016-12-29: 160 mL via INTRA_ARTERIAL

## 2016-12-29 MED ORDER — LABETALOL HCL 5 MG/ML IV SOLN
10.0000 mg | INTRAVENOUS | Status: AC | PRN
  Administered 2016-12-29: 14:00:00 via INTRAVENOUS
  Filled 2016-12-29: qty 4

## 2016-12-29 MED ORDER — TICAGRELOR 90 MG PO TABS
90.0000 mg | ORAL_TABLET | Freq: Two times a day (BID) | ORAL | Status: DC
  Administered 2016-12-29 – 2016-12-30 (×2): 90 mg via ORAL
  Filled 2016-12-29 (×2): qty 1

## 2016-12-29 MED ORDER — HEPARIN (PORCINE) IN NACL 2-0.9 UNIT/ML-% IJ SOLN
INTRAMUSCULAR | Status: AC
  Filled 2016-12-29: qty 500

## 2016-12-29 MED ORDER — HYDRALAZINE HCL 20 MG/ML IJ SOLN: 5.0000 mg | INTRAMUSCULAR | Status: AC | PRN

## 2016-12-29 MED ORDER — BIVALIRUDIN TRIFLUOROACETATE 250 MG IV SOLR
INTRAVENOUS | Status: AC
  Filled 2016-12-29: qty 250

## 2016-12-29 MED ORDER — SODIUM CHLORIDE 0.9 % IV SOLN
INTRAVENOUS | Status: DC | PRN
  Administered 2016-12-29: 1.75 mg/kg/h via INTRAVENOUS

## 2016-12-29 MED ORDER — FENTANYL CITRATE (PF) 100 MCG/2ML IJ SOLN
INTRAMUSCULAR | Status: AC
  Filled 2016-12-29: qty 2

## 2016-12-29 MED ORDER — SODIUM CHLORIDE 0.9 % IV SOLN: INTRAVENOUS | Status: AC

## 2016-12-29 MED ORDER — HEPARIN (PORCINE) IN NACL 2-0.9 UNIT/ML-% IJ SOLN
INTRAMUSCULAR | Status: AC
  Filled 2016-12-29: qty 1000

## 2016-12-29 MED ORDER — SODIUM CHLORIDE 0.9% FLUSH: 3.0000 mL | INTRAVENOUS | Status: DC | PRN

## 2016-12-29 MED ORDER — LIDOCAINE HCL (PF) 1 % IJ SOLN
INTRAMUSCULAR | Status: AC
  Filled 2016-12-29: qty 30

## 2016-12-29 MED ORDER — ACTIVE PARTNERSHIP FOR HEALTH OF YOUR HEART BOOK
Freq: Once | Status: AC
  Administered 2016-12-29: 17:00:00
  Filled 2016-12-29: qty 1

## 2016-12-29 MED ORDER — ANGIOPLASTY BOOK
Freq: Once | Status: AC
  Administered 2016-12-29: 17:00:00
  Filled 2016-12-29: qty 1

## 2016-12-29 MED ORDER — SODIUM CHLORIDE 0.9 % IV SOLN: 250.0000 mL | INTRAVENOUS | Status: DC | PRN

## 2016-12-29 MED ORDER — TICAGRELOR 90 MG PO TABS
ORAL_TABLET | ORAL | Status: DC | PRN
  Administered 2016-12-29: 180 mg via ORAL

## 2016-12-29 MED ORDER — MIDAZOLAM HCL 2 MG/2ML IJ SOLN
INTRAMUSCULAR | Status: AC
  Filled 2016-12-29: qty 2

## 2016-12-29 MED ORDER — IOPAMIDOL (ISOVUE-370) INJECTION 76%
INTRAVENOUS | Status: AC
  Filled 2016-12-29: qty 125

## 2016-12-29 MED ORDER — TICAGRELOR 90 MG PO TABS
ORAL_TABLET | ORAL | Status: AC
  Filled 2016-12-29: qty 2

## 2016-12-29 MED ORDER — LIDOCAINE HCL (PF) 1 % IJ SOLN
INTRAMUSCULAR | Status: DC | PRN
  Administered 2016-12-29: 10 mL

## 2016-12-29 MED ORDER — IOPAMIDOL (ISOVUE-370) INJECTION 76%
INTRAVENOUS | Status: AC
  Filled 2016-12-29: qty 100

## 2016-12-29 MED ORDER — SODIUM CHLORIDE 0.9% FLUSH
3.0000 mL | Freq: Two times a day (BID) | INTRAVENOUS | Status: DC
  Administered 2016-12-29 – 2016-12-30 (×3): 3 mL via INTRAVENOUS

## 2016-12-29 MED ORDER — FENTANYL CITRATE (PF) 100 MCG/2ML IJ SOLN
INTRAMUSCULAR | Status: DC | PRN
  Administered 2016-12-29: 50 ug via INTRAVENOUS

## 2016-12-29 MED ORDER — HEPARIN (PORCINE) IN NACL 2-0.9 UNIT/ML-% IJ SOLN
INTRAMUSCULAR | Status: AC | PRN
  Administered 2016-12-29: 500 mL via INTRA_ARTERIAL

## 2016-12-29 MED ORDER — MIDAZOLAM HCL 2 MG/2ML IJ SOLN
INTRAMUSCULAR | Status: DC | PRN
  Administered 2016-12-29: 2 mg via INTRAVENOUS

## 2016-12-29 MED ORDER — BIVALIRUDIN BOLUS VIA INFUSION - CUPID
INTRAVENOUS | Status: DC | PRN
  Administered 2016-12-29: 63.375 mg via INTRAVENOUS

## 2016-12-29 MED ORDER — HEART ATTACK BOUNCING BOOK
Freq: Once | Status: AC
  Administered 2016-12-29: 23:00:00
  Filled 2016-12-29: qty 1

## 2016-12-29 SURGICAL SUPPLY — 20 items
CATH EXPO 5F MPA-1 (CATHETERS) ×2 IMPLANT
CATH INFINITI 5FR MULTPACK ANG (CATHETERS) ×2 IMPLANT
CATH LAUNCHER 6FR AR1 (CATHETERS) ×2 IMPLANT
CATH SWAN GANZ 7F STRAIGHT (CATHETERS) ×2 IMPLANT
CATHETER LAUNCHER 6FR LCB (CATHETERS) ×2 IMPLANT
DEVICE SPIDERFX EMB PROT 4MM (WIRE) ×2 IMPLANT
KIT ENCORE 26 ADVANTAGE (KITS) ×2 IMPLANT
KIT HEART LEFT (KITS) ×2 IMPLANT
KIT HEART RIGHT NAMIC (KITS) ×2 IMPLANT
PACK CARDIAC CATHETERIZATION (CUSTOM PROCEDURE TRAY) ×2 IMPLANT
SHEATH PINNACLE 5F 10CM (SHEATH) ×2 IMPLANT
SHEATH PINNACLE 6F 10CM (SHEATH) ×2 IMPLANT
SHEATH PINNACLE 7F 10CM (SHEATH) ×2 IMPLANT
STENT RESOLUTE ONYX 3.5X12 (Permanent Stent) ×2 IMPLANT
STENT RESOLUTE ONYX 3.5X18 (Permanent Stent) ×4 IMPLANT
TRANSDUCER W/STOPCOCK (MISCELLANEOUS) ×2 IMPLANT
TUBING CIL FLEX 10 FLL-RA (TUBING) ×2 IMPLANT
WIRE COUGAR XT STRL 190CM (WIRE) ×2 IMPLANT
WIRE EMERALD 3MM-J .025X260CM (WIRE) ×2 IMPLANT
WIRE EMERALD 3MM-J .035X150CM (WIRE) ×2 IMPLANT

## 2016-12-29 NOTE — Interval H&P Note (Signed)
History and Physical Interval Note:  12/29/2016 9:08 AM  Tamara Carpenter  has presented today for cardiac cath with the diagnosis of cardiomyopathy, CAD s/p bypass, elevated troponin. The various methods of treatment have been discussed with the patient and family. After consideration of risks, benefits and other options for treatment, the patient has consented to  Procedure(s): RIGHT/LEFT HEART CATH AND CORONARY/GRAFT ANGIOGRAPHY (N/A) as a surgical intervention .  The patient's history has been reviewed, patient examined, no change in status, stable for surgery.  I have reviewed the patient's chart and labs.  Questions were answered to the patient's satisfaction.    Cath Lab Visit (complete for each Cath Lab visit)  Clinical Evaluation Leading to the Procedure:   ACS: Yes.    Non-ACS:    Anginal Classification: CCS III  Anti-ischemic medical therapy: No Therapy  Non-Invasive Test Results: No non-invasive testing performed  Prior CABG: Previous CABG        Lauree Chandler

## 2016-12-29 NOTE — H&P (View-Only) (Signed)
See full consult note by Dr. Harl Bowie 12/28/16.  Pt transferred to [facility] for cardiac cath.  Admitted with CHF and mild troponin elevation.  Known CAD with prior 3V CABG in 2002.   No changes in exam or plan since yesterday.   Tamara Carpenter 12/29/2016 9:07 AM

## 2016-12-29 NOTE — Progress Notes (Addendum)
PROGRESS NOTE    Diania Co  NFA:[id number] DOB: 1950/12/27 DOA: 12/27/2016 PCP: [facility]    Brief Narrative:   Ms. Meditz is a 66 year old female patient with history of CAD status post MI and CABG 2001, hypertension, diabetes mellitus status post multiple toe amputations, PVD with leg stents 2013 and basal cell CA of the nose status post resection 2004, admitted July 31 by Dr Lorin Mercy for acute CHF, and hand laceration while cooking.  Her ECHO showed EF of 25-30 percent, and he was seen by cardiology and Dr Harl Bowie felt that ICM needs to be r/out.   Assessment & Plan:   Principal Problem:   CHF exacerbation ([place]) Active Problems:   NSTEMI (non-ST elevated myocardial infarction) ([place])   Diabetes mellitus type 2 in obese ([place])   Tobacco dependence   Essential hypertension   S/P CABG x 3   PVD (peripheral vascular disease) ([place])   Acute heart failure (HCC)   Elevated troponin   1. Acute systolic CHF.  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  She has slight positve in the troponin, and was started on IV Heparin.  Per cardiology, patient transferred to [facility] for cardiac cath.  Cardiac cath showed severe triple-vessel coronary artery disease, successful DES 2 SVG to OM, successful DES  SVG to RCA. Recommendation is for DAPT with ASA and Brilinta for one year. Continue statin and beta blocker. EF is 25-30% 2. [place]:  She was given dT, and had sutures.  3. DM:  Metformin was held.  She was given SSI. Hemoglobin A1c 7.2 4. Tobacco abuse:  Advised stop.  5. CAD-status post CABG 2001,  recommendations as above 6. Peripheral vascular disease, stable 7. Hypertension-continue Coreg and lisinopril   DVT prophylaxis:  IV Heparin. Code Status:  DNR confirmed per admitting physician.  Family Communication: None at bedside.  Disposition-likely discharge home tomorrow  Consultants:   Cardiology.   Procedures:  None.  Antimicrobials: Anti-infectives    None      Subjective:    Status post PCI, chest pain-free    Objective: Vitals:   12/29/16 1100 12/29/16 1105 12/29/16 1110 12/29/16 1115  BP: (!) 144/84 (!) 149/83 (!) 143/90   Pulse: 86 79 87 (!) 128  Resp: 10 13 12  (!) 25  Temp:      TempSrc:      SpO2: 92% (!) 88% 94% 95%  Weight:      Height:        Intake/Output Summary (Last 24 hours) at 12/29/16 1216 Last data filed at 12/29/16 0830  Gross per 24 hour  Intake           595.17 ml  Output              900 ml  Net          -304.83 ml   Filed Weights   12/28/16 0500 12/28/16 1853 12/29/16 0334  Weight: 86.4 kg (190 lb 8 oz) 86.2 kg (190 lb) 84.5 kg (186 lb 4.6 oz)    Examination:  General exam: Appears calm and comfortable  Respiratory system: Clear to auscultation. Respiratory effort normal. Cardiovascular system: S1 & S2 heard, RRR. No JVD, murmurs, rubs, gallops or clicks. No pedal edema. Gastrointestinal system: Abdomen is nondistended, soft and nontender. No organomegaly or masses felt. Normal bowel sounds heard. Central nervous system: Alert and oriented. No focal neurological deficits. Extremities: Symmetric 5 x 5 power. Skin: No rashes, lesions or ulcers Psychiatry: Judgement and insight appear normal. Mood &  affect appropriate.   Data Reviewed: I have personally reviewed following labs and imaging studies  CBC:  Recent Labs Lab 12/27/16 1901 12/27/16 2351  WBC 8.2 9.5  NEUTROABS 4.7 5.6  HGB 12.7 13.0  HCT 38.5 39.6  MCV 90.4 91.0  PLT 247 993   Basic Metabolic Panel:  Recent Labs Lab 12/27/16 1901 12/28/16 0629  NA 140 141  K 3.9 3.7  CL 111 107  CO2 20* 23  GLUCOSE 134* 174*  BUN 15 14  CREATININE 0.76 0.86  CALCIUM 9.5 9.0   GFR: Estimated Creatinine Clearance: 73.9 mL/min (by C-G formula based on SCr of 0.86 mg/dL). Liver Function Tests:  Recent Labs Lab 12/27/16 1901  AST 13*  ALT 9*  ALKPHOS 73  BILITOT 0.5  PROT 7.1  ALBUMIN 3.4*   Coagulation Profile:  Recent Labs Lab 12/28/16 1204    INR 1.21   Cardiac Enzymes:  Recent Labs Lab 12/27/16 1901 12/27/16 2351 12/28/16 0632 12/28/16 1204  TROPONINI 0.55* 0.52* 0.54* 0.54*   CBG:  Recent Labs Lab 12/28/16 1202 12/28/16 1700 12/28/16 1906 12/29/16 0755 12/29/16 1149  GLUCAP 101* 140* 93 141* 190*   Lipid Profile:  Recent Labs  12/28/16 0632  CHOL 181  HDL 29*  LDLCALC 105*  TRIG 237*  CHOLHDL 6.2   Thyroid Function Tests:  Recent Labs  12/28/16 1231  TSH 1.353   Radiology Studies: Dg Chest 2 View  Result Date: 12/27/2016 CLINICAL DATA:  Short of breath for 2 weeks.  Productive cough. EXAM: CHEST  2 VIEW COMPARISON:  None. FINDINGS: Moderate cardiomegaly. Vascular congestion. Central basilar interstitial edema. Small pleural effusions. Increased AP diameter of the chest with kyphosis. There are mild wedge compression deformities involving T6, T7, T8, and T9. There is osteopenia in these have a chronic appearance. Postoperative changes from CABG and sternotomy are noted. IMPRESSION: CHF. Multiple mid-level thoracic compression deformities as described. Electronically Signed   By: Marybelle Killings M.D.   On: 12/27/2016 19:35    Scheduled Meds: . aspirin EC  81 mg Oral Daily  . atorvastatin  40 mg Oral q1800  . carvedilol  3.125 mg Oral BID WC  . furosemide  40 mg Intravenous Q12H  . insulin aspart  0-15 Units Subcutaneous TID WC  . insulin aspart  0-5 Units Subcutaneous QHS  . lisinopril  5 mg Oral Daily  . nicotine  14 mg Transdermal Daily  . potassium chloride  20 mEq Oral BID  . sodium chloride flush  3 mL Intravenous Q12H  . sodium chloride flush  3 mL Intravenous Q12H  . ticagrelor  90 mg Oral BID   Continuous Infusions: . sodium chloride    . sodium chloride    . sodium chloride      LOS: 2 days   Reyne Dumas, MD   Hospitalist.  If 7PM-7AM, please contact night-coverage [email or web address] Password [email or web address] 12/29/2016, 12:16 PM

## 2016-12-29 NOTE — Progress Notes (Signed)
Pt left for a surgery at 0900am in a stable condition

## 2016-12-29 NOTE — Progress Notes (Addendum)
Sheath Pull Arterial and Venous Site right groin,  Site assessment prior to removal "0",  Manual pressure applied for 30 minutes.  Patient stable during procedure, VSS.  Post sheath groin assessment "0".  Patient instructed to apply direct pressure to site when coughing, laughing, or sneezing and to keep head on pillow until bed rest is completed.  Distal pulses +1 post pull.  Pt tolerated procedure well.  Report and instructions given to Kern Reap, RT bedside RN.  Bedrest to begin at 1430.  Darrel Reach, RN

## 2016-12-29 NOTE — Care Management Note (Signed)
Case Management Note  Patient Details  Name: Tamara Carpenter MRN: [id number] Date of Birth: November 13, 1950  Subjective/Objective:   From home with her sister Tamara Carpenter and her brother, she has PCP at the [facility], she has Medicaid and co pay is 3.70, she goes to the [facility] in [place], and they do have [facility] in stock.                  Action/Plan: NCM will follow for dc needs.   Expected Discharge Date:                  Expected Discharge Plan:  Home/Self Care  In-House Referral:     Discharge planning Services  CM Consult  Post Acute Care Choice:    Choice offered to:     DME Arranged:    DME Agency:     HH Arranged:    [place] Agency:     Status of Service:  Completed, signed off  If discussed at [employer] of Stay Meetings, dates discussed:    Additional Comments:  Zenon Mayo, RN 12/29/2016, 3:24 PM

## 2016-12-29 NOTE — Progress Notes (Signed)
See full consult note by Dr. Harl Bowie 12/28/16.  Pt transferred to [facility] for cardiac cath.  Admitted with CHF and mild troponin elevation.  Known CAD with prior 3V CABG in 2002.   No changes in exam or plan since yesterday.   Lauree Chandler 12/29/2016 9:07 AM

## 2016-12-30 ENCOUNTER — Encounter (HOSPITAL_COMMUNITY): Payer: Self-pay | Admitting: Cardiovascular Disease

## 2016-12-30 ENCOUNTER — Telehealth: Payer: Self-pay

## 2016-12-30 DIAGNOSIS — I5022 Chronic systolic (congestive) heart failure: Secondary | ICD-10-CM

## 2016-12-30 DIAGNOSIS — Z951 Presence of aortocoronary bypass graft: Secondary | ICD-10-CM

## 2016-12-30 DIAGNOSIS — I214 Non-ST elevation (NSTEMI) myocardial infarction: Principal | ICD-10-CM

## 2016-12-30 DIAGNOSIS — F17200 Nicotine dependence, unspecified, uncomplicated: Secondary | ICD-10-CM

## 2016-12-30 DIAGNOSIS — Z955 Presence of coronary angioplasty implant and graft: Secondary | ICD-10-CM

## 2016-12-30 DIAGNOSIS — I10 Essential (primary) hypertension: Secondary | ICD-10-CM

## 2016-12-30 LAB — POCT ACTIVATED CLOTTING TIME: Activated Clotting Time: 318 seconds

## 2016-12-30 LAB — POCT I-STAT 3, ART BLOOD GAS (G3+)
Acid-base deficit: 3 mmol/L — ABNORMAL HIGH (ref 0.0–2.0)
BICARBONATE: 21 mmol/L (ref 20.0–28.0)
O2 Saturation: 91 %
PCO2 ART: 31.2 mmHg — AB (ref 32.0–48.0)
PH ART: 7.436 (ref 7.350–7.450)
TCO2: 22 mmol/L (ref 0–100)
pO2, Arterial: 58 mmHg — ABNORMAL LOW (ref 83.0–108.0)

## 2016-12-30 LAB — POCT I-STAT 3, VENOUS BLOOD GAS (G3P V)
Bicarbonate: 25.3 mmol/L (ref 20.0–28.0)
O2 SAT: 56 %
PCO2 VEN: 41.7 mmHg — AB (ref 44.0–60.0)
PO2 VEN: 30 mmHg — AB (ref 32.0–45.0)
TCO2: 27 mmol/L (ref 0–100)
pH, Ven: 7.391 (ref 7.250–7.430)

## 2016-12-30 LAB — COMPREHENSIVE METABOLIC PANEL
ALT: 9 U/L — ABNORMAL LOW (ref 14–54)
AST: 17 U/L (ref 15–41)
Albumin: 3.7 g/dL (ref 3.5–5.0)
Alkaline Phosphatase: 74 U/L (ref 38–126)
Anion gap: 13 (ref 5–15)
BILIRUBIN TOTAL: 0.7 mg/dL (ref 0.3–1.2)
BUN: 19 mg/dL (ref 6–20)
CHLORIDE: 100 mmol/L — AB (ref 101–111)
CO2: 23 mmol/L (ref 22–32)
Calcium: 9.4 mg/dL (ref 8.9–10.3)
Creatinine, Ser: 1.1 mg/dL — ABNORMAL HIGH (ref 0.44–1.00)
GFR, EST NON AFRICAN AMERICAN: 52 mL/min — AB (ref >60)
Glucose, Bld: 143 mg/dL — ABNORMAL HIGH (ref 65–99)
POTASSIUM: 3.9 mmol/L (ref 3.5–5.1)
Sodium: 136 mmol/L (ref 135–145)
TOTAL PROTEIN: 7.4 g/dL (ref 6.5–8.1)

## 2016-12-30 LAB — CBC
HEMATOCRIT: 42 % (ref 36.0–46.0)
HEMOGLOBIN: 13.9 g/dL (ref 12.0–15.0)
MCH: 29.3 pg (ref 26.0–34.0)
MCHC: 33.1 g/dL (ref 30.0–36.0)
MCV: 88.4 fL (ref 78.0–100.0)
Platelets: 273 10*3/uL (ref 150–400)
RBC: 4.75 MIL/uL (ref 3.87–5.11)
RDW: 15 % (ref 11.5–15.5)
WBC: 10.3 10*3/uL (ref 4.0–10.5)

## 2016-12-30 LAB — GLUCOSE, CAPILLARY
GLUCOSE-CAPILLARY: 143 mg/dL — AB (ref 65–99)
GLUCOSE-CAPILLARY: 177 mg/dL — AB (ref 65–99)

## 2016-12-30 MED ORDER — ATORVASTATIN CALCIUM 80 MG PO TABS: 80.0000 mg | ORAL_TABLET | Freq: Every day | ORAL | 1 refills | Status: DC

## 2016-12-30 MED ORDER — NICOTINE 14 MG/24HR TD PT24: 14.0000 mg | MEDICATED_PATCH | Freq: Every day | TRANSDERMAL | 0 refills | Status: DC

## 2016-12-30 MED ORDER — TICAGRELOR 90 MG PO TABS: 90.0000 mg | ORAL_TABLET | Freq: Two times a day (BID) | ORAL | 0 refills | Status: DC

## 2016-12-30 MED ORDER — LISINOPRIL 5 MG PO TABS: 5.0000 mg | ORAL_TABLET | Freq: Every day | ORAL | 1 refills | Status: DC

## 2016-12-30 MED ORDER — FUROSEMIDE 40 MG PO TABS: 40.0000 mg | ORAL_TABLET | Freq: Two times a day (BID) | ORAL | 11 refills | Status: DC

## 2016-12-30 MED ORDER — METFORMIN HCL 1000 MG PO TABS: 1000.0000 mg | ORAL_TABLET | Freq: Two times a day (BID) | ORAL | 1 refills | Status: DC

## 2016-12-30 MED ORDER — ATORVASTATIN CALCIUM 80 MG PO TABS: 80.0000 mg | ORAL_TABLET | Freq: Every day | ORAL | Status: DC

## 2016-12-30 MED ORDER — CARVEDILOL 3.125 MG PO TABS: 3.1250 mg | ORAL_TABLET | Freq: Two times a day (BID) | ORAL | 1 refills | Status: DC

## 2016-12-30 NOTE — Progress Notes (Signed)
CARDIAC REHAB PHASE I   PRE:  Rate/Rhythm: 83 SR    BP: sitting 106/60    SaO2: 96 RA  MODE:  Ambulation: 500 ft   POST:  Rate/Rhythm: 106 ST    BP: sitting 143/66     SaO2: 98 RA  Tolerated well. SOB improved. VSS, steady. Ed completed including HF, stents, restrictions, Brilinta, diet, ex, NTG, smoking cessation and CRPII. Voiced understanding. Sts she is thinking about quitting smoking but it is hard. She is living with her sister who smokes inside. Discussed atleast smoking outside. She appreciated fake cigarette. Will refer to [place].  [phone]   Darrick Meigs CES, ACSM 12/30/2016 9:33 AM

## 2016-12-30 NOTE — Telephone Encounter (Signed)
Patient contacted regarding discharge from [facility] on 12/30/16.  Patient understands to follow up with provider luke Kilroy on 01/17/17 at 130 pm at [place]. Patient understands discharge instructions?  Patient understands medications and regiment?  Patient understands to bring all medications to this visit?     Spoke with sister cathy who will have patient call if she has any questions with discharge instructions.

## 2016-12-30 NOTE — Telephone Encounter (Signed)
-----   Message from Desma Paganini sent at 12/30/2016  9:25 AM EDT ----- TCM scheduled w. Luke on 01/17/17  Thank you

## 2016-12-30 NOTE — Progress Notes (Signed)
Progress Note  Patient Name: Tamara Carpenter Date of Encounter: 12/30/2016  Primary Cardiologist: New to Dr. Harl Bowie  Subjective   Dyspnea improving. No chest pain.   Inpatient Medications    Scheduled Meds: . aspirin EC  81 mg Oral Daily  . atorvastatin  40 mg Oral q1800  . carvedilol  3.125 mg Oral BID WC  . furosemide  40 mg Intravenous Q12H  . insulin aspart  0-15 Units Subcutaneous TID WC  . insulin aspart  0-5 Units Subcutaneous QHS  . lisinopril  5 mg Oral Daily  . nicotine  14 mg Transdermal Daily  . potassium chloride  20 mEq Oral BID  . sodium chloride flush  3 mL Intravenous Q12H  . sodium chloride flush  3 mL Intravenous Q12H  . ticagrelor  90 mg Oral BID   Continuous Infusions: . sodium chloride    . sodium chloride     PRN Meds: sodium chloride, sodium chloride, acetaminophen, ondansetron (ZOFRAN) IV, sodium chloride flush, sodium chloride flush, traMADol   Vital Signs    Vitals:   12/29/16 1907 12/29/16 2000 12/30/16 0227 12/30/16 0707  BP: (!) 102/34  121/68 122/62  Pulse: 92  78 88  Resp: (!) 28 (!) 26 18 20   Temp: 97.8 F (36.6 C)  97.6 F (36.4 C) 97.8 F (36.6 C)  TempSrc: Oral  Oral Oral  SpO2: 95%  97% 97%  Weight:   189 lb 13.1 oz (86.1 kg)   Height:        Intake/Output Summary (Last 24 hours) at 12/30/16 0853 Last data filed at 12/30/16 8588  Gross per 24 hour  Intake              120 ml  Output             2050 ml  Net            -1930 ml   Filed Weights   12/28/16 1853 12/29/16 0334 12/30/16 0227  Weight: 190 lb (86.2 kg) 186 lb 4.6 oz (84.5 kg) 189 lb 13.1 oz (86.1 kg)    Telemetry    Sinus rhythm - Personally Reviewed  ECG    SR with persistent TWI in lateral leads - Personally Reviewed  Physical Exam   GEN: No acute distress.   Neck: No JVD Cardiac: RRR, no murmurs, rubs, or gallops. R groin cath site without hematoma Respiratory: course breath sound with diffuse wheez GI: Soft, nontender, non-distended  MS: No  edema; No deformity. Neuro:  Nonfocal  Psych: Normal affect   Labs    Chemistry Recent Labs Lab 12/27/16 1901 12/28/16 0629 12/30/16 0349  NA 140 141 136  K 3.9 3.7 3.9  CL 111 107 100*  CO2 20* 23 23  GLUCOSE 134* 174* 143*  BUN 15 14 19   CREATININE 0.76 0.86 1.10*  CALCIUM 9.5 9.0 9.4  PROT 7.1  --  7.4  ALBUMIN 3.4*  --  3.7  AST 13*  --  17  ALT 9*  --  9*  ALKPHOS 73  --  74  BILITOT 0.5  --  0.7  GFRNONAA >60 >60 52*  GFRAA >60 >60 >60  ANIONGAP 9 11 13      Hematology Recent Labs Lab 12/27/16 1901 12/27/16 2351 12/30/16 0349  WBC 8.2 9.5 10.3  RBC 4.26 4.35 4.75  HGB 12.7 13.0 13.9  HCT 38.5 39.6 42.0  MCV 90.4 91.0 88.4  MCH 29.8 29.9 29.3  MCHC 33.0 32.8 33.1  RDW 15.5 15.4 15.0  PLT 247 263 273    Cardiac Enzymes Recent Labs Lab 12/27/16 1901 12/27/16 2351 12/28/16 0632 12/28/16 1204  TROPONINI 0.55* 0.52* 0.54* 0.54*   No results for input(s): TROPIPOC in the last 168 hours.   BNP Recent Labs Lab 12/27/16 1908  BNP 1,937.0*     DDimer No results for input(s): DDIMER in the last 168 hours.   Radiology    No results found.  Cardiac Studies   CORONARY STENT INTERVENTION   12/29/16  RIGHT/LEFT HEART CATH AND CORONARY/GRAFT ANGIOGRAPHY  Conclusion     SVG graft was visualized by angiography.  Ost RCA to Prox RCA lesion, 100 %stenosed.  Prox Cx to Mid Cx lesion, 100 %stenosed.  SVG graft was visualized by angiography.  A STENT RESOLUTE ONYX 3.5X18 drug eluting stent was successfully placed.  Dist Graft lesion, 99 %stenosed.  Post intervention, there is a 0% residual stenosis.  Prox Graft lesion, 99 %stenosed.  Post intervention, there is a 0% residual stenosis.  A STENT RESOLUTE ONYX 3.5X12 drug eluting stent was successfully placed.  Prox LAD to Mid LAD lesion, 99 %stenosed.  LIMA graft was visualized by angiography.  LV end diastolic pressure is normal.  Mid LAD to Dist LAD lesion, 30 %stenosed.   1.  Severe triple vessel CAD s/p 3V CABG with 3/3 patent bypass grafts 2. The mid LAD has high grade stenosis. The mid and distal LAD fills from the patent LIMA graft 3. The Circumflex is occluded in the mid segment. The OM fills from the patent vein graft. The proximal body of the vein graft has a 99% stenosis.  4. Successful PTCA/DES x 2 proximal body of SVG to OM 5. The RCA is proximally occluded. The vein graft to the distal RCA has a 99% stenosis.  6. Successful PTCA/DES x 1 distal body of SVG to RCA  Recommendations: DAPT with ASA and Brilinta for one year. Continue statin and beta blocker.    Limited Echo 12/28/16 LV EF: 25% -   30%  ------------------------------------------------------------------- Indications:      Dyspnea 786.09.  ------------------------------------------------------------------- History:   PMH:  Elevated Troponin.  Congestive heart failure. PMH:   Myocardial infarction.  Risk factors:  Current tobacco use. Hypertension. Diabetes mellitus.  ------------------------------------------------------------------- Study Conclusions  - Left ventricle: Systolic function was severely reduced. The   estimated ejection fraction was in the range of 25% to 30%.   Diffuse hypokinesis. - Regional wall motion abnormality: Severe hypokinesis of the   apical septal, apical lateral, and apical myocardium. There no   clear evidence of apical thrombus. - Limited study with echocontrast to evaluate LV function and wall   motion.  Patient Profile     66 y.o. female with history of CAD status post MI and CABG 2001, hypertension, diabetes mellitus status post multiple toe amputations, PVD with leg stents 2013 and basal cell CA of the nose status post resection 2004 who = admitted with SOB, DOE, and orthopnea to [facility] and transferred to [facility] for L&R cath due to finding of LVEF of 25-30%.  Assessment & Plan    1. Acute systolic CHF/ICM - Echo with LVEF of 25-30%. BNP 1937. Net I &  O negative 2.7L. Detailed cath report as above. S/p Successful PTCA/DES x 2 proximal body of SVG to OM & PTCA/DES x 1 distal body of SVG to RCA.  - Continue Coreg and lisinopril. LV EDP is normal. Currently on IV lasix 40mg  BID. Change to po  at discharge. Volume status stable.   2. Elevated troponin with hx of CAD s/p CABG and now PCI of SVG to OM x 2 and SVG to RCA x 1 - Flat troponin trend. Cath as above. Continue ASA, Brillinta, statin and BB.   3. HLD - 12/28/2016: Cholesterol 181; HDL 29; LDL Cholesterol 105; Triglycerides 237; VLDL 47  - LDL goal less than 70. Will increase lipitor to 80mg . Consider lipid panel and LFT in 6 weeks.   4. HTN - Stable and controlled.   Follow up at [facility] office on 8/21. F/u with PCP in 5 days with BMET.   Signed, Leanor Kail, PA  12/30/2016, 8:53 AM    I have examined the patient and reviewed assessment and plan and discussed with patient.  Agree with above as stated.  Right groin site stable.  No hematoma.  Right foot warm. She did well walking with cardiac rehabilitation.  Her SHOB has improved.  Nasal abnormality noted on exam.  Continue dual antiplatelet therapy. I stressed the importance of Brilinta. Okay for discharge later today.  Larae Grooms

## 2016-12-30 NOTE — Discharge Summary (Signed)
Physician Discharge Summary  Tamara Carpenter MRN: [id number] DOB/AGE: 02-Aug-1950 66 y.o.  PCP: [facility] date: 12/27/2016 Discharge date: 12/30/2016  Discharge Diagnoses:    Principal Problem:   CHF exacerbation [facility]) Active Problems:   NSTEMI (non-ST elevated myocardial infarction) ([place])   Diabetes mellitus type 2 in obese ([place])   Tobacco dependence   Essential hypertension   S/P CABG x 3   PVD (peripheral vascular disease) ([place])   Acute heart failure (HCC)   Elevated troponin    Follow-up recommendations Follow-up with PCP in 3-5 days , including all  additional recommended appointments as below Follow-up CBC, CMP in 3-5 days Follow up at [facility] office on 8/21. F/u with PCP in 5 days with [facility].   Consider lipid panel and LFT in 6 weeks.     Current Discharge Medication List    START taking these medications   Details  atorvastatin (LIPITOR) 80 MG tablet Take 1 tablet (80 mg total) by mouth daily at 6 PM. Qty: 30 tablet, Refills: 1    carvedilol (COREG) 3.125 MG tablet Take 1 tablet (3.125 mg total) by mouth 2 (two) times daily with a meal. Qty: 60 tablet, Refills: 1    furosemide (LASIX) 40 MG tablet Take 1 tablet (40 mg total) by mouth 2 (two) times daily. Qty: 60 tablet, Refills: 11    lisinopril (PRINIVIL,ZESTRIL) 5 MG tablet Take 1 tablet (5 mg total) by mouth daily. Qty: 30 tablet, Refills: 1    nicotine (NICODERM CQ - DOSED IN MG/24 HOURS) 14 mg/24hr patch Place 1 patch (14 mg total) onto the skin daily. Qty: 28 patch, Refills: 0    ticagrelor (BRILINTA) 90 MG TABS tablet Take 1 tablet (90 mg total) by mouth 2 (two) times daily. Qty: 60 tablet, Refills: 0      CONTINUE these medications which have NOT CHANGED   Details  aspirin EC 81 MG tablet Take 81 mg by mouth daily.    Cholecalciferol (VITAMIN D3) 5000 units CAPS Take 1 capsule by mouth daily.    metFORMIN (GLUCOPHAGE) 1000 MG tablet Take 1,000 mg by mouth 2  (two) times daily with a meal.    nitroGLYCERIN (NITROSTAT) 0.4 MG SL tablet Place 0.4 mg under the tongue every 5 (five) minutes as needed for chest pain.    traMADol (ULTRAM) 50 MG tablet Take 50 mg by mouth every 6 (six) hours as needed for moderate pain or severe pain.      STOP taking these medications     clopidogrel (PLAVIX) 75 MG tablet      pravastatin (PRAVACHOL) 80 MG tablet          Discharge Condition: *Stable as per cardiology   Discharge Instructions Get Medicines reviewed and adjusted: Please take all your medications with you for your next visit with your Primary MD  Please request your Primary MD to go over all hospital tests and procedure/radiological results at the follow up, please ask your Primary MD to get all Hospital records sent to his/her office.  If you experience worsening of your admission symptoms, develop shortness of breath, life threatening emergency, suicidal or homicidal thoughts you must seek medical attention immediately by calling 911 or calling your MD immediately if symptoms less severe.  You must read complete instructions/literature along with all the possible adverse reactions/side effects for all the Medicines you take and that have been prescribed to you. Take any new Medicines after you have completely understood and  accpet all the possible adverse reactions/side effects.   Do not drive when taking Pain medications.   Do not take more than prescribed Pain, Sleep and Anxiety Medications  Special Instructions: If you have smoked or chewed Tobacco in the last 2 yrs please stop smoking, stop any regular Alcohol and or any Recreational drug use.  Wear Seat belts while driving.  Please note  You were cared for by a hospitalist during your hospital stay. Once you are discharged, your primary care physician will handle any further medical issues. Please note that NO REFILLS for any discharge medications will be authorized once you are  discharged, as it is imperative that you return to your primary care physician (or establish a relationship with a primary care physician if you do not have one) for your aftercare needs so that they can reassess your need for medications and monitor your lab values.  Discharge Instructions    Amb Referral to Cardiac Rehabilitation    Complete by:  As directed    Diagnosis:   Coronary Stents PTCA Heart Failure (see criteria below if ordering Phase II) NSTEMI     Heart Failure Type:  Chronic Systolic   Diet - low sodium heart healthy    Complete by:  As directed    Increase activity slowly    Complete by:  As directed        Allergies  Allergen Reactions  . Nitroglycerin     NOTE: Patient states that she can take the tablets but is not able to use the patches      Disposition: Home per cardiology    Consults:  Cardiology    Significant Diagnostic Studies:  Dg Chest 2 View  Result Date: 12/27/2016 CLINICAL DATA:  Short of breath for 2 weeks.  Productive cough. EXAM: CHEST  2 VIEW COMPARISON:  None. FINDINGS: Moderate cardiomegaly. Vascular congestion. Central basilar interstitial edema. Small pleural effusions. Increased AP diameter of the chest with kyphosis. There are mild wedge compression deformities involving T6, T7, T8, and T9. There is osteopenia in these have a chronic appearance. Postoperative changes from CABG and sternotomy are noted. IMPRESSION: CHF. Multiple mid-level thoracic compression deformities as described. Electronically Signed   By: Marybelle Killings M.D.   On: 12/27/2016 19:35     Cardiac cath  SVG graft was visualized by angiography.  Ost RCA to Prox RCA lesion, 100 %stenosed.  Prox Cx to Mid Cx lesion, 100 %stenosed.  SVG graft was visualized by angiography.  A STENT RESOLUTE ONYX 3.5X18 drug eluting stent was successfully placed.  Dist Graft lesion, 99 %stenosed.  Post intervention, there is a 0% residual stenosis.  Prox Graft lesion, 99  %stenosed.  Post intervention, there is a 0% residual stenosis.  A STENT RESOLUTE ONYX 3.5X12 drug eluting stent was successfully placed.  Prox LAD to Mid LAD lesion, 99 %stenosed.  LIMA graft was visualized by angiography.  LV end diastolic pressure is normal.  Mid LAD to Dist LAD lesion, 30 %stenosed.   1. Severe triple vessel CAD s/p 3V CABG with 3/3 patent bypass grafts 2. The mid LAD has high grade stenosis. The mid and distal LAD fills from the patent LIMA graft 3. The Circumflex is occluded in the mid segment. The OM fills from the patent vein graft. The proximal body of the vein graft has a 99% stenosis.  4. Successful PTCA/DES x 2 proximal body of SVG to OM 5. The RCA is proximally occluded. The vein graft to the distal  RCA has a 99% stenosis.  6. Successful PTCA/DES x 1 distal body of SVG to RCA  Recommendations: DAPT with ASA and Brilinta for one year. Continue statin and beta blocker.       Filed Weights   12/28/16 1853 12/29/16 0334 12/30/16 0227  Weight: 86.2 kg (190 lb) 84.5 kg (186 lb 4.6 oz) 86.1 kg (189 lb 13.1 oz)     Microbiology: No results found for this or any previous visit (from the past 240 hour(s)).     Blood Culture No results found for: SDES, [place], CULT, REPTSTATUS    Labs: Results for orders placed or performed during the hospital encounter of 12/27/16 (from the past 48 hour(s))  Glucose, capillary     Status: Abnormal   Collection Time: 12/28/16 12:02 PM  Result Value Ref Range   Glucose-Capillary 101 (H) 65 - 99 mg/dL  Troponin I     Status: Abnormal   Collection Time: 12/28/16 12:04 PM  Result Value Ref Range   Troponin I 0.54 (HH) <0.03 ng/mL    Comment: CRITICAL VALUE NOTED.  VALUE IS CONSISTENT WITH PREVIOUSLY REPORTED AND CALLED VALUE.  Protime-INR     Status: Abnormal   Collection Time: 12/28/16 12:04 PM  Result Value Ref Range   Prothrombin Time 15.4 (H) 11.4 - 15.2 seconds   INR 1.21   TSH     Status: None    Collection Time: 12/28/16 12:31 PM  Result Value Ref Range   TSH 1.353 0.350 - 4.500 uIU/mL    Comment: Performed by a 3rd Generation assay with a functional sensitivity of <=0.01 uIU/mL.  Glucose, capillary     Status: Abnormal   Collection Time: 12/28/16  5:00 PM  Result Value Ref Range   Glucose-Capillary 140 (H) 65 - 99 mg/dL   Comment 1 Notify RN   Heparin level (unfractionated)     Status: None   Collection Time: 12/28/16  5:02 PM  Result Value Ref Range   Heparin Unfractionated 0.33 0.30 - 0.70 IU/mL    Comment:        IF HEPARIN RESULTS ARE BELOW EXPECTED VALUES, AND PATIENT DOSAGE HAS BEEN CONFIRMED, SUGGEST FOLLOW UP TESTING OF ANTITHROMBIN III LEVELS.   Glucose, capillary     Status: None   Collection Time: 12/28/16  7:06 PM  Result Value Ref Range   Glucose-Capillary 93 65 - 99 mg/dL  Glucose, capillary     Status: Abnormal   Collection Time: 12/29/16  7:55 AM  Result Value Ref Range   Glucose-Capillary 141 (H) 65 - 99 mg/dL   Comment 1 Notify RN   Glucose, capillary     Status: Abnormal   Collection Time: 12/29/16 11:49 AM  Result Value Ref Range   Glucose-Capillary 190 (H) 65 - 99 mg/dL   Comment 1 Notify RN    Comment 2 Document in Chart   Glucose, capillary     Status: Abnormal   Collection Time: 12/29/16  4:58 PM  Result Value Ref Range   Glucose-Capillary 170 (H) 65 - 99 mg/dL   Comment 1 Notify RN    Comment 2 Document in Chart   Glucose, capillary     Status: Abnormal   Collection Time: 12/29/16  9:27 PM  Result Value Ref Range   Glucose-Capillary 198 (H) 65 - 99 mg/dL  CBC     Status: None   Collection Time: 12/30/16  3:49 AM  Result Value Ref Range   WBC 10.3 4.0 - 10.5 K/uL  RBC 4.75 3.87 - 5.11 MIL/uL   Hemoglobin 13.9 12.0 - 15.0 g/dL   HCT 42.0 36.0 - 46.0 %   MCV 88.4 78.0 - 100.0 fL   MCH 29.3 26.0 - 34.0 pg   MCHC 33.1 30.0 - 36.0 g/dL   RDW 15.0 11.5 - 15.5 %   Platelets 273 150 - 400 K/uL  Comprehensive metabolic panel      Status: Abnormal   Collection Time: 12/30/16  3:49 AM  Result Value Ref Range   Sodium 136 135 - 145 mmol/L   Potassium 3.9 3.5 - 5.1 mmol/L   Chloride 100 (L) 101 - 111 mmol/L   CO2 23 22 - 32 mmol/L   Glucose, Bld 143 (H) 65 - 99 mg/dL   BUN 19 6 - 20 mg/dL   Creatinine, Ser 1.10 (H) 0.44 - 1.00 mg/dL   Calcium 9.4 8.9 - 10.3 mg/dL   Total Protein 7.4 6.5 - 8.1 g/dL   Albumin 3.7 3.5 - 5.0 g/dL   AST 17 15 - 41 U/L   ALT 9 (L) 14 - 54 U/L   Alkaline Phosphatase 74 38 - 126 U/L   Total Bilirubin 0.7 0.3 - 1.2 mg/dL   GFR calc non Af Amer 52 (L) >60 mL/min   GFR calc Af Amer >60 >60 mL/min    Comment: (NOTE) The eGFR has been calculated using the CKD EPI equation. This calculation has not been validated in all clinical situations. eGFR's persistently <60 mL/min signify possible Chronic Kidney Disease.    Anion gap 13 5 - 15  Glucose, capillary     Status: Abnormal   Collection Time: 12/30/16  6:05 AM  Result Value Ref Range   Glucose-Capillary 143 (H) 65 - 99 mg/dL     Lipid Panel     Component Value Date/Time   CHOL 181 12/28/2016 0632   TRIG 237 (H) 12/28/2016 0632   HDL 29 (L) 12/28/2016 0632   CHOLHDL 6.2 12/28/2016 0632   VLDL 47 (H) 12/28/2016 0632   LDLCALC 105 (H) 12/28/2016 3419     Lab Results  Component Value Date   HGBA1C 7.2 (H) 12/27/2016     Lab Results  Component Value Date   LDLCALC 105 (H) 12/28/2016   CREATININE 1.10 (H) 12/30/2016     HPI :  66 year old female patient with history of CAD status post MI and CABG 2001, hypertension, diabetes mellitus status post multiple toe amputations, PVD with leg stents 2013 and basal cell CA of the nose status post resection 2004, admitted July 31 by Dr Lorin Mercy for acute CHF, and hand laceration while cooking.  Her ECHO showed EF of 25-30 percent, and she was seen by cardiology and Dr Harl Bowie felt that ICM needs to be r/out  HOSPITAL COURSE:     1. Acute systolic CHF.  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    troponin was slightly positive, and was started on IV Heparin.  Per cardiology, patient transferred to [facility] for cardiac cath.  Cardiac cath showed severe triple-vessel coronary artery disease, successful DES 2 SVG to OM, successful DES  SVG to RCA. Recommendation is for DAPT with ASA and Brilinta for one year. Continue statin and beta blocker. Lasix changed to oral.  EF is 25-30%. Okay to discharge from a cardiology standpoint with follow-up appointment on 8/21 2. [place]:  She was given dT, and had sutures.  3. DM:  Metformin resumed 48 hours post cath.  She was given SSI. Hemoglobin A1c 7.2 4. Tobacco abuse:  Advised stop.  5. CAD-status post CABG 2001,  recommendations as above 6. Peripheral vascular disease, stable 7. Hypertension-continue Coreg and lisinopril, monitor renal function closely   Discharge Exam:   Blood pressure 122/62, pulse 88, temperature 97.8 F (36.6 C), temperature source Oral, resp. rate 20, height '5\' 7"'  (1.702 m), weight 86.1 kg (189 lb 13.1 oz), SpO2 97 %.  GEN:No acute distress.   Neck:No JVD Cardiac:RRR, no murmurs, rubs, or gallops. R groin cath site without hematoma Respiratory:course breath sound with diffuse wheez VD:PBAQ, nontender, non-distended  MS:No edema; No deformity. Neuro:Nonfocal  Psych: Normal affect     Follow-up Information    [facility]. Go on 01/17/2017.   Specialty:  Cardiology Why:  '@1' :30 for post hospital with Kerin Ransom, [facility] Contact information: [place] [phone]       [facility]. Call.   Why:  Hospital follow-up in 3-5 days, follow renal function closely Contact information: [address] [place] [address] [phone]           Signed: Reyne Dumas 12/30/2016, 10:21 AM        Time spent >1 hour

## 2017-01-17 ENCOUNTER — Ambulatory Visit (INDEPENDENT_AMBULATORY_CARE_PROVIDER_SITE_OTHER): Payer: Medicaid Other | Admitting: Cardiology

## 2017-01-17 ENCOUNTER — Encounter: Payer: Self-pay | Admitting: Cardiology

## 2017-01-17 ENCOUNTER — Other Ambulatory Visit (HOSPITAL_COMMUNITY)
Admission: RE | Admit: 2017-01-17 | Discharge: 2017-01-17 | Disposition: A | Discharge status: Home / Self Care | Payer: Medicaid Other | Source: Ambulatory Visit | Attending: Cardiology | Admitting: Cardiology

## 2017-01-17 DIAGNOSIS — I952 Hypotension due to drugs: Secondary | ICD-10-CM | POA: Insufficient documentation

## 2017-01-17 DIAGNOSIS — I255 Ischemic cardiomyopathy: Secondary | ICD-10-CM | POA: Diagnosis not present

## 2017-01-17 DIAGNOSIS — I2510 Atherosclerotic heart disease of native coronary artery without angina pectoris: Secondary | ICD-10-CM | POA: Insufficient documentation

## 2017-01-17 DIAGNOSIS — E669 Obesity, unspecified: Secondary | ICD-10-CM

## 2017-01-17 DIAGNOSIS — I5021 Acute systolic (congestive) heart failure: Secondary | ICD-10-CM | POA: Diagnosis not present

## 2017-01-17 DIAGNOSIS — Z9861 Coronary angioplasty status: Secondary | ICD-10-CM

## 2017-01-17 DIAGNOSIS — I959 Hypotension, unspecified: Secondary | ICD-10-CM | POA: Insufficient documentation

## 2017-01-17 DIAGNOSIS — Z951 Presence of aortocoronary bypass graft: Secondary | ICD-10-CM | POA: Diagnosis not present

## 2017-01-17 DIAGNOSIS — E1169 Type 2 diabetes mellitus with other specified complication: Secondary | ICD-10-CM

## 2017-01-17 DIAGNOSIS — F17200 Nicotine dependence, unspecified, uncomplicated: Secondary | ICD-10-CM | POA: Diagnosis not present

## 2017-01-17 LAB — CBC WITH DIFFERENTIAL/PLATELET
Basophils Absolute: 0 10*3/uL (ref 0.0–0.1)
Basophils Relative: 0 %
Eosinophils Absolute: 0.8 10*3/uL — ABNORMAL HIGH (ref 0.0–0.7)
Eosinophils Relative: 6 %
HCT: 43.3 % (ref 36.0–46.0)
Hemoglobin: 14.8 g/dL (ref 12.0–15.0)
Lymphocytes Relative: 13 %
Lymphs Abs: 1.7 10*3/uL (ref 0.7–4.0)
MCH: 30.1 pg (ref 26.0–34.0)
MCHC: 34.2 g/dL (ref 30.0–36.0)
MCV: 88 fL (ref 78.0–100.0)
Monocytes Absolute: 0.9 10*3/uL (ref 0.1–1.0)
Monocytes Relative: 7 %
Neutro Abs: 9.2 10*3/uL — ABNORMAL HIGH (ref 1.7–7.7)
Neutrophils Relative %: 74 %
Platelets: 260 10*3/uL (ref 150–400)
RBC: 4.92 MIL/uL (ref 3.87–5.11)
RDW: 14 % (ref 11.5–15.5)
WBC: 12.7 10*3/uL — ABNORMAL HIGH (ref 4.0–10.5)

## 2017-01-17 LAB — BASIC METABOLIC PANEL
Anion gap: 13 (ref 5–15)
BUN: 60 mg/dL — ABNORMAL HIGH (ref 6–20)
CO2: 20 mmol/L — ABNORMAL LOW (ref 22–32)
Calcium: 9 mg/dL (ref 8.9–10.3)
Chloride: 101 mmol/L (ref 101–111)
Creatinine, Ser: 1.99 mg/dL — ABNORMAL HIGH (ref 0.44–1.00)
GFR calc Af Amer: 29 mL/min — ABNORMAL LOW (ref >60)
GFR calc non Af Amer: 25 mL/min — ABNORMAL LOW (ref >60)
Glucose, Bld: 191 mg/dL — ABNORMAL HIGH (ref 65–99)
Potassium: 4.1 mmol/L (ref 3.5–5.1)
Sodium: 134 mmol/L — ABNORMAL LOW (ref 135–145)

## 2017-01-17 MED ORDER — TICAGRELOR 90 MG PO TABS: 90.0000 mg | ORAL_TABLET | Freq: Two times a day (BID) | ORAL | 6 refills | Status: DC

## 2017-01-17 NOTE — Patient Instructions (Signed)
Medication Instructions:  Stop lisinopril Stop lasix   Labwork: today  Testing/Procedures: none  Follow-Up: Your physician recommends that you schedule a follow-up appointment in: 1 week    Any Other Special Instructions Will Be Listed Below (If Applicable).     If you need a refill on your cardiac medications before your next appointment, please call your pharmacy.

## 2017-01-17 NOTE — Assessment & Plan Note (Signed)
CABG x 3 2001- SVG-RCA, SVG-OM, LIMA-LAD

## 2017-01-17 NOTE — Assessment & Plan Note (Signed)
Seen today as TOC f/u

## 2017-01-17 NOTE — Assessment & Plan Note (Signed)
1 PPD 

## 2017-01-17 NOTE — Assessment & Plan Note (Signed)
Suspect she is dehydrated

## 2017-01-17 NOTE — Assessment & Plan Note (Signed)
EF 25% 12/29/16

## 2017-01-17 NOTE — Assessment & Plan Note (Signed)
Chronic  Metformin 

## 2017-01-17 NOTE — Addendum Note (Signed)
Addended by: Erlene Quan on: 01/17/2017 02:14 PM   Modules accepted: Level of Service

## 2017-01-17 NOTE — Progress Notes (Signed)
01/17/2017 Tamara Carpenter   1951-01-15  [id number]  Primary Physician [facility] Primary Cardiologist: Dr Harl Bowie  HPI:  66 year old female patient with history of CAD status post MI and CABG 2001x3, hypertension, diabetes mellitus status,status post multiple toe amputations, PVD with leg stents 2013, and basal cell CA of the nose status post resection 2004. She has been followed in [place] for many years but recently moved to [place] for 9 months and now here living with her sister. She had been out of all her meds for over 1 yr when seen by Dr Harl Bowie for CHF 12/28/16. She had presented with another problem to [facility] and mentioned a history of SOB. An echo showed her EF to be 25%. She was transferred to [facility] for cath. This revealed patent grafts but a 99% proximal SVG-OM stenosis and a 99% distal SVG-RCA stenosis. She received 3 DES. She was discharged on lasix 40 mg BID, ACE, statin, Brilinta, ASA, Metformin, and Coreg.   She is in the office today for a TOC f/u. She was doing well till 48 hrs ago when she started having loose stools and nausea and vomiting.  She has a B/P of 82 systolic in the office today. She has not missed her medications.   Current Outpatient Prescriptions  Medication Sig Dispense Refill  . aspirin EC 81 MG tablet Take 81 mg by mouth daily.    Marland Kitchen atorvastatin (LIPITOR) 80 MG tablet Take 1 tablet (80 mg total) by mouth daily at 6 PM. 30 tablet 1  . carvedilol (COREG) 3.125 MG tablet Take 1 tablet (3.125 mg total) by mouth 2 (two) times daily with a meal. 60 tablet 1  . Cholecalciferol (VITAMIN D3) 5000 units CAPS Take 1 capsule by mouth daily.    . metFORMIN (GLUCOPHAGE) 1000 MG tablet Take 1 tablet (1,000 mg total) by mouth 2 (two) times daily with a meal. 30 tablet 1  . nicotine (NICODERM CQ - DOSED IN MG/24 HOURS) 14 mg/24hr patch Place 1 patch (14 mg total) onto the skin daily. 28 patch 0  . nitroGLYCERIN (NITROSTAT) 0.4 MG SL tablet Place 0.4 mg  under the tongue every 5 (five) minutes as needed for chest pain.    . ticagrelor (BRILINTA) 90 MG TABS tablet Take 1 tablet (90 mg total) by mouth 2 (two) times daily. 60 tablet 6  . traMADol (ULTRAM) 50 MG tablet Take 50 mg by mouth every 6 (six) hours as needed for moderate pain or severe pain.     No current facility-administered medications for this visit.     Allergies  Allergen Reactions  . Nitroglycerin     NOTE: Patient states that she can take the tablets but is not able to use the patches    Past Medical History:  Diagnosis Date  . Back pain   . 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 CAD (coronary artery disease)    a. CABG in 2001 b. cath 12/29/16 - Successful PTCA/DES x 2 proximal body of SVG to OM & PTCA/DES x 1 distal body of SVG to RCA.   . Diabetes mellitus without complication ([place])   . Hypertension   . PVD (peripheral vascular disease) ([place])    has stents in both legs    Social History   Social History  . Marital status: Single    Spouse name: N/A  . Number of children: N/A  . Years of  education: N/A   Occupational History  . disabled    Social History Main Topics  . Smoking status: Heavy Tobacco Smoker    Packs/day: 1.00    Years: 48.00    Types: Cigarettes  . Smokeless tobacco: Never Used  . Alcohol use No  . Drug use: No  . Sexual activity: Not on file   Other Topics Concern  . Not on file   Social History Narrative  . No narrative on file     Family History  Problem Relation Age of Onset  . CAD Mother 59     Review of Systems: General: negative for chills, fever, night sweats or weight changes.  Cardiovascular: negative for chest pain, dyspnea on exertion, edema, orthopnea, palpitations, paroxysmal nocturnal dyspnea or shortness of breath Dermatological: negative for rash Respiratory: negative for cough or wheezing Urologic: negative for hematuria Abdominal: negative for nausea, vomiting, diarrhea,  bright red blood per rectum, melena, or hematemesis Neurologic: negative for visual changes, syncope, or dizziness All other systems reviewed and are otherwise negative except as noted above.    Blood pressure (!) 82/54, pulse 93, height 5\' 7"  (1.702 m), weight 173 lb (78.5 kg), SpO2 98 %.  General appearance: alert, cooperative, no distress, moderately obese and in wheel chair, s/p resection of a large portion of her nose Neck: no JVD Lungs: clear to auscultation bilaterally Heart: regular rate and rhythm Extremities: extremities normal, atraumatic, no cyanosis or edema Skin: pale, cool, dry Neurologic: Grossly normal   ASSESSMENT AND PLAN:   Hypotension Suspect she is dehydrated  CAD S/P percutaneous coronary angioplasty Svg-OM p99%- received DES x 2, and Svg_RCA d99%-received DES 12/29/16  S/P CABG x 3 CABG x 3 2001- SVG-RCA, SVG-OM, LIMA-LAD  Cardiomyopathy, ischemic EF 25% 12/29/16  Acute heart failure (HCC) Seen today as TOC f/u  Tobacco dependence 1PPD  Diabetes mellitus type 2 in obese (HCC) Chronic Metformin   PLAN  I stopped her Lasix and Lisinopril. She'll have a CBC and BMP today. F/U B/P next week with an Therapist, sports.   Kerin Ransom PA-C 01/17/2017 2:04 PM

## 2017-01-17 NOTE — Assessment & Plan Note (Signed)
Svg-OM p99%- received DES x 2, and Svg_RCA d99%-received DES 12/29/16

## 2017-01-24 ENCOUNTER — Ambulatory Visit (INDEPENDENT_AMBULATORY_CARE_PROVIDER_SITE_OTHER): Payer: Medicaid Other | Admitting: *Deleted

## 2017-01-24 VITALS — BP 97/62 | HR 97

## 2017-01-24 DIAGNOSIS — I952 Hypotension due to drugs: Secondary | ICD-10-CM | POA: Diagnosis not present

## 2017-01-24 NOTE — Progress Notes (Signed)
Pt reports feeling tired. She feel congested at this time.Pt has been seen by PCP. No other complaints at this time.

## 2017-02-02 ENCOUNTER — Ambulatory Visit: Payer: Medicaid Other | Admitting: Adult Health

## 2017-02-02 ENCOUNTER — Encounter: Payer: Self-pay | Admitting: Adult Health

## 2017-02-02 NOTE — Progress Notes (Deleted)
Cardiology Office Note   Date:  02/02/2017   ID:  Tamara Carpenter, DOB 12/26/1950, MRN [id number]  PCP:  [facility]  Cardiologist:  Branch No chief complaint on file.     History of Present Illness: Tamara Carpenter is a 66 y.o. female who presents for ongoing assessment and management of coronary artery disease, history of myocardial infarction with subsequent coronary artery bypass grafting in 0160, chronic systolic heart failure with most recent ejection fraction of 25% per echocardiogram, cardiac catheterization was completed revealing patent grafts but a 99% proximal SVG to OM stenosis and a 99% distal SVG to RCA stenosis. She received 3 drug-eluting stents. hypertension, peripheral vascular disease with multiple toe amputations, diabetes mellitus, and basal cell cancer of his nose status post resection in 2004.  She was last seen in the office on 01/17/2017, by Kerin Ransom, PA post hospitalization after catheterization and PCI.Marland Kitchen She is complaining at that time of loose stools and feeling weak with nausea and vomiting. She is found to be hypotensive with a blood pressure of 82 systolic. On that office visit Lasix and lisinopril were discontinued. Follow-up CBC and BMET were ordered. She was to have a follow-up office visit with RN for blood pressure evaluation and assessment. She did follow-up on 01/24/2017 continued to feel tired and congested in her chest. She had just been seen by her primary care physician. She had no further complaints. Blood pressure was not documented    Past Medical History:  Diagnosis Date  . Back pain   . 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 CAD (coronary artery disease)    a. CABG in 2001 b. cath 12/29/16 - Successful PTCA/DES x 2 proximal body of SVG to OM & PTCA/DES x 1 distal body of SVG to RCA.   . Diabetes mellitus without complication ([place])   . Hypertension   . PVD (peripheral vascular disease) ([place])      has stents in both legs    Past Surgical History:  Procedure Laterality Date  . cardiac bypass  2001   3v  . CORONARY STENT INTERVENTION N/A 12/29/2016   Procedure: CORONARY STENT INTERVENTION;  Surgeon: Burnell Blanks, MD;  Location: [facility];  Service: Cardiovascular;  Laterality: N/A;  . multiple toe amputations  2010-2017   3 toes on the right foot, 1 on the left  . nose removal  2004   basal cell carcinoma  . RIGHT/LEFT HEART CATH AND CORONARY/GRAFT ANGIOGRAPHY N/A 12/29/2016   Procedure: RIGHT/LEFT HEART CATH AND CORONARY/GRAFT ANGIOGRAPHY;  Surgeon: Burnell Blanks, MD;  Location: [facility];  Service: Cardiovascular;  Laterality: N/A;     Current Outpatient Prescriptions  Medication Sig Dispense Refill  . aspirin EC 81 MG tablet Take 81 mg by mouth daily.    Marland Kitchen atorvastatin (LIPITOR) 80 MG tablet Take 1 tablet (80 mg total) by mouth daily at 6 PM. 30 tablet 1  . carvedilol (COREG) 3.125 MG tablet Take 1 tablet (3.125 mg total) by mouth 2 (two) times daily with a meal. 60 tablet 1  . Cholecalciferol (VITAMIN D3) 5000 units CAPS Take 1 capsule by mouth daily.    . metFORMIN (GLUCOPHAGE) 1000 MG tablet Take 1 tablet (1,000 mg total) by mouth 2 (two) times daily with a meal. 30 tablet 1  . nicotine (NICODERM CQ - DOSED IN MG/24 HOURS) 14 mg/24hr patch Place 1 patch (14 mg total) onto  the skin daily. 28 patch 0  . nitroGLYCERIN (NITROSTAT) 0.4 MG SL tablet Place 0.4 mg under the tongue every 5 (five) minutes as needed for chest pain.    . ticagrelor (BRILINTA) 90 MG TABS tablet Take 1 tablet (90 mg total) by mouth 2 (two) times daily. 60 tablet 6  . traMADol (ULTRAM) 50 MG tablet Take 50 mg by mouth every 6 (six) hours as needed for moderate pain or severe pain.     No current facility-administered medications for this visit.     Allergies:   Nitroglycerin    Social History:  The patient  reports that she has been smoking Cigarettes.  She has a  48.00 pack-year smoking history. She has never used smokeless tobacco. She reports that she does not drink alcohol or use drugs.   Family History:  The patient's family history includes CAD (age of onset: 65) in her mother.    ROS: All other systems are reviewed and negative. Unless otherwise mentioned in H&P    PHYSICAL EXAM: VS:  There were no vitals taken for this visit. , BMI There is no height or weight on file to calculate BMI. GEN: Well nourished, well developed, in no acute distress HEENT: normal Neck: no JVD, carotid bruits, or masses Cardiac: ***RRR; no murmurs, rubs, or gallops,no edema  Respiratory:  clear to auscultation bilaterally, normal work of breathing GI: soft, nontender, nondistended, + BS MS: no deformity or atrophy Skin: warm and dry, no rash Neuro:  Strength and sensation are intact Psych: euthymic mood, full affect   EKG:  EKG {ACTION; IS/IS [id number]} ordered today. The ekg ordered today demonstrates ***   Recent Labs: 12/27/2016: B Natriuretic Peptide 1,937.0 12/28/2016: TSH 1.353 12/30/2016: ALT 9 01/17/2017: BUN 60; Creatinine, Ser 1.99; Hemoglobin 14.8; Platelets 260; Potassium 4.1; Sodium 134    Lipid Panel    Component Value Date/Time   CHOL 181 12/28/2016 0632   TRIG 237 (H) 12/28/2016 0632   HDL 29 (L) 12/28/2016 0632   CHOLHDL 6.2 12/28/2016 0632   VLDL 47 (H) 12/28/2016 0632   LDLCALC 105 (H) 12/28/2016 0632      Wt Readings from Last 3 Encounters:  01/17/17 173 lb (78.5 kg)  12/30/16 189 lb 13.1 oz (86.1 kg)      Other studies Reviewed: Additional studies/ records that were reviewed today include: ***. Review of the above records demonstrates: ***   ASSESSMENT AND PLAN:  1.  ***   Current medicines are reviewed at length with the patient today.    Labs/ tests ordered today include: *** Phill Myron. West Pugh, ANP, AACC   02/02/2017 7:34 AM    [facility] [address] [place], [place]  [address] Phone: [phone]; Fax: [phone]

## 2017-02-17 ENCOUNTER — Encounter: Payer: Self-pay | Admitting: Adult Health

## 2017-02-17 ENCOUNTER — Ambulatory Visit (INDEPENDENT_AMBULATORY_CARE_PROVIDER_SITE_OTHER): Payer: Medicaid Other | Admitting: Adult Health

## 2017-02-17 VITALS — BP 146/96 | HR 90 | Wt 173.0 lb

## 2017-02-17 DIAGNOSIS — I2510 Atherosclerotic heart disease of native coronary artery without angina pectoris: Secondary | ICD-10-CM

## 2017-02-17 DIAGNOSIS — I255 Ischemic cardiomyopathy: Secondary | ICD-10-CM | POA: Diagnosis not present

## 2017-02-17 DIAGNOSIS — I5022 Chronic systolic (congestive) heart failure: Secondary | ICD-10-CM

## 2017-02-17 DIAGNOSIS — I10 Essential (primary) hypertension: Secondary | ICD-10-CM | POA: Diagnosis not present

## 2017-02-17 DIAGNOSIS — F17200 Nicotine dependence, unspecified, uncomplicated: Secondary | ICD-10-CM | POA: Diagnosis not present

## 2017-02-17 MED ORDER — CARVEDILOL 6.25 MG PO TABS: 6.2500 mg | ORAL_TABLET | Freq: Two times a day (BID) | ORAL | 3 refills | Status: DC

## 2017-02-17 NOTE — Patient Instructions (Signed)
Medication Instructions:  Your physician has recommended you make the following change in your medication:  Increase Coreg to 6.25 mg Two Times Daily    Labwork: Your physician recommends that you return for lab work in: Today    Testing/Procedures: NONE  Follow-Up: Your physician recommends that you schedule a follow-up appointment in: 1 Week for Blood Pressure Check   Your physician recommends that you schedule a follow-up appointment in: 1 Month    Any Other Special Instructions Will Be Listed Below (If Applicable).     If you need a refill on your cardiac medications before your next appointment, please call your pharmacy.  Thank you for choosing [facility]!

## 2017-02-17 NOTE — Progress Notes (Signed)
Cardiology Office Note   Date:  02/17/2017   ID:  Tamara Carpenter, DOB 1950/11/02, MRN [id number]  PCP:  [facility]  Cardiologist:  Carlyle Dolly MD  Chief Complaint  Patient presents with  . Hypertension  . Cardiomyopathy  . Coronary Artery Disease      History of Present Illness: Tamara Carpenter is a 66 y.o. female who presents for ongoing assessment and management of coronary artery disease, stent to the SVG to OM, and SVG to RCA on 12/29/2016 history of CABG in 2001 (three-vessel), ischemic cardiomyopathy with EF of 25% hypertension, diabetes, peripheral vascular disease with multiple toe amputations, leg stents in 2013, basal cell CA of the nose with resection in 2004. She had been followed in the past in [place] but has recently moved to this area and was seen at our office on 01/17/2017 by Kerin Ransom, PA for [facility]  follow-up.  At that office visit the patient complained of diarrhea and was found be hypotensive. She had been medically compliant, it was suspected that she was hypotensive due to dehydration. At that time the patient's Lasix was stopped in the setting of dehydration with follow-up labs. She did have a follow-up appointment on 01/24/2017 for blood pressure check with the blood pressure 97/62 O2 sat 94%. She was feeling lung congestion. She was to follow-up with PCP at that time.  She has a feeling much better. No dizziness. Breathing status is improved. Unfortunately continues to smoke.    Past Medical History:  Diagnosis Date  . Back pain   . 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 CAD (coronary artery disease)    a. CABG in 2001 b. cath 12/29/16 - Successful PTCA/DES x 2 proximal body of SVG to OM & PTCA/DES x 1 distal body of SVG to RCA.   . Diabetes mellitus without complication ([place])   . Hypertension   . PVD (peripheral vascular disease) ([place])    has stents in both legs    Past Surgical History:    Procedure Laterality Date  . cardiac bypass  2001   3v  . CORONARY STENT INTERVENTION N/A 12/29/2016   Procedure: CORONARY STENT INTERVENTION;  Surgeon: Burnell Blanks, MD;  Location: [facility];  Service: Cardiovascular;  Laterality: N/A;  . multiple toe amputations  2010-2017   3 toes on the right foot, 1 on the left  . nose removal  2004   basal cell carcinoma  . RIGHT/LEFT HEART CATH AND CORONARY/GRAFT ANGIOGRAPHY N/A 12/29/2016   Procedure: RIGHT/LEFT HEART CATH AND CORONARY/GRAFT ANGIOGRAPHY;  Surgeon: Burnell Blanks, MD;  Location: [facility];  Service: Cardiovascular;  Laterality: N/A;     Current Outpatient Prescriptions  Medication Sig Dispense Refill  . aspirin EC 81 MG tablet Take 81 mg by mouth daily.    Marland Kitchen atorvastatin (LIPITOR) 80 MG tablet Take 1 tablet (80 mg total) by mouth daily at 6 PM. 30 tablet 1  . Cholecalciferol (VITAMIN D3) 5000 units CAPS Take 1 capsule by mouth daily.    . nicotine (NICODERM CQ - DOSED IN MG/24 HOURS) 14 mg/24hr patch Place 1 patch (14 mg total) onto the skin daily. 28 patch 0  . nitroGLYCERIN (NITROSTAT) 0.4 MG SL tablet Place 0.4 mg under the tongue every 5 (five) minutes as needed for chest pain.    . ticagrelor (BRILINTA) 90 MG TABS tablet Take 1 tablet (90 mg total) by mouth  2 (two) times daily. 60 tablet 6  . traMADol (ULTRAM) 50 MG tablet Take 50 mg by mouth every 6 (six) hours as needed for moderate pain or severe pain.    . carvedilol (COREG) 6.25 MG tablet Take 1 tablet (6.25 mg total) by mouth 2 (two) times daily. 180 tablet 3   No current facility-administered medications for this visit.     Allergies:   Nitroglycerin    Social History:  The patient  reports that she has been smoking Cigarettes.  She has a 48.00 pack-year smoking history. She has never used smokeless tobacco. She reports that she does not drink alcohol or use drugs.   Family History:  The patient's family history includes CAD (age  of onset: 1) in her mother.    ROS: All other systems are reviewed and negative. Unless otherwise mentioned in H&P    PHYSICAL EXAM: VS:  BP (!) 146/96   Pulse 90   Wt 173 lb (78.5 kg)   SpO2 97%   BMI 27.10 kg/m  , BMI Body mass index is 27.1 kg/m. GEN: Well nourished, well developed, in no acute distress  HEENT: normal absent front portion of nose. Neck: no JVD, carotid bruits, or masses Cardiac: RRR; no murmurs, rubs, or gallops,no edema  Respiratory:  Is mature expiratory wheezing. GI: soft, nontender, nondistended, + BS MS: no deformity or atrophy  Skin: warm and dry, no rash Neuro:  Strength and sensation are intact Psych: euthymic mood, full affect   Recent Labs: 12/27/2016: B Natriuretic Peptide 1,937.0 01/19/2017: TSH 1.353 12/30/2016: ALT 9 01/17/2017: BUN 60; Creatinine, Ser 1.99; Hemoglobin 14.8; Platelets 260; Potassium 4.1; Sodium 134    Lipid Panel    Component Value Date/Time   CHOL 181 January 19, 2017 0632   TRIG 237 (H) 2017-01-19 0632   HDL 29 (L) 19-Jan-2017 0632   CHOLHDL 6.2 Jan 19, 2017 0632   VLDL 47 (H) 01-19-2017 0632   LDLCALC 105 (H) 01-19-2017 0632      Wt Readings from Last 3 Encounters:  02/17/17 173 lb (78.5 kg)  01/17/17 173 lb (78.5 kg)  12/30/16 189 lb 13.1 oz (86.1 kg)      Other studies Reviewed: Echocardiogram 01/19/2017 Study Conclusions  - Left ventricle: Systolic function was severely reduced. The   estimated ejection fraction was in the range of 25% to 30%.   Diffuse hypokinesis. - Regional wall motion abnormality: Severe hypokinesis of the   apical septal, apical lateral, and apical myocardium. There no   clear evidence of apical thrombus. - Limited study with echocontrast to evaluate LV function and wall   motion.   ASSESSMENT AND PLAN:  1.  Chronic systolic dysfunction: Patient's blood pressure is not optimal for current ejection fraction. I'm going to increase her carvedilol to 6.25 mg twice a day. She was taken off  of Lasix due to renal insufficiency. May consider adding hydralazine if blood pressure is not better controlled. Would like to add back ACE inhibitor however BMET will be ordered to evaluate kidney function prior to doing so.. May need to consider adding nitrates instead if kidney function is not improved. She is currently not on diuretics. She does have some mild lower extremity edema nonpitting. We'll not begin this again until reevaluation of labs.  2. Coronary artery disease: Patient has a history of stents to the SVG to OM, and SVG to RCA completed in August 2018. She continues on statin therapy, and beta blocker along with aspirin and Brilinta. Patient will continue these  medications with labs as described.  3. Ongoing tobacco abuse: She is advised on smoking cessation, especially with known coronary artery disease to prevent progression. She states she is trying to cut down but is not completely quit.  Current medicines are reviewed at length with the patient today.    Labs/ tests ordered today include: BMET  Phill Myron. West Pugh, ANP, AACC   02/17/2017 4:45 PM    [facility] [address], [place], [place] [address] Phone: [phone]; Fax: [phone]

## 2017-02-20 ENCOUNTER — Other Ambulatory Visit (HOSPITAL_COMMUNITY)
Admission: RE | Admit: 2017-02-20 | Discharge: 2017-02-20 | Disposition: A | Discharge status: Home / Self Care | Payer: Medicaid Other | Source: Ambulatory Visit | Attending: Adult Health | Admitting: Adult Health

## 2017-02-20 DIAGNOSIS — I5022 Chronic systolic (congestive) heart failure: Secondary | ICD-10-CM | POA: Diagnosis present

## 2017-02-20 LAB — BASIC METABOLIC PANEL
ANION GAP: 11 (ref 5–15)
BUN: 15 mg/dL (ref 6–20)
CO2: 19 mmol/L — AB (ref 22–32)
Calcium: 9.2 mg/dL (ref 8.9–10.3)
Chloride: 108 mmol/L (ref 101–111)
Creatinine, Ser: 0.91 mg/dL (ref 0.44–1.00)
GFR calc Af Amer: >60 mL/min (ref >60)
GFR calc non Af Amer: >60 mL/min (ref >60)
GLUCOSE: 188 mg/dL — AB (ref 65–99)
POTASSIUM: 4.4 mmol/L (ref 3.5–5.1)
Sodium: 138 mmol/L (ref 135–145)

## 2017-02-24 ENCOUNTER — Ambulatory Visit (INDEPENDENT_AMBULATORY_CARE_PROVIDER_SITE_OTHER): Payer: Medicaid Other | Admitting: *Deleted

## 2017-02-24 ENCOUNTER — Ambulatory Visit: Payer: Medicaid Other

## 2017-02-24 VITALS — BP 136/80 | HR 86 | Ht 67.0 in | Wt 173.2 lb

## 2017-02-24 DIAGNOSIS — I10 Essential (primary) hypertension: Secondary | ICD-10-CM

## 2017-02-24 NOTE — Progress Notes (Signed)
BP looks good. No further changes. Thank you.

## 2017-02-24 NOTE — Progress Notes (Signed)
Patient presents for nurse BP check d/t recent increase in carvedilol 6.25 mg twice daily. Patient has taken all doses of prescribed medications except atorvastatin, stating she stopped this on 02/19/17 because she felt it was giving her a UTI. Patient denies dizziness, chest pain or sob, and side effects to all other medications.

## 2017-02-24 NOTE — Patient Instructions (Signed)
You will receive a phone call with further instructions after review by your provider.  Continue all medications the same. Keep your follow up appointment as planned.

## 2017-03-16 NOTE — Progress Notes (Signed)
Cardiology Office Note   Date:  03/17/2017   ID:  Tamara Carpenter, DOB 01/14/1951, MRN [id number]  PCP:  [facility]  Cardiologist:  Carlyle Dolly, MD  Chief Complaint  Patient presents with  . Coronary Artery Disease  . Hypertension  . Cardiomyopathy   History of Present Illness: Tamara Carpenter is a 66 y.o. female who presents for ongoing assessment and management of ischemic cardiomyopathy, EF 25%, coronary artery disease (stent to SVG to OM, and SVG to RCA, on 12/29/2016 with CABG in 2001-3 vessel) hypertension, peripheral vascular disease with multiple toe amputations, stents to the lower extremity vasculature, diabetes, and history of basal cell cancer of the nose with resection in 2004.  She was last seen in the office on 02/17/2017, she is found to be hypotensive, and had been dealing with a GI virus with multiple episodes of diarrhea. Her Lasix had been stopped after being treated for diarrhea in the setting of dehydration. On follow-up visit blood pressure was elevated and not optimal for reduced EF of 25%. I increase her carvedilol to 6.25 mg twice a day. Consideration for adding hydralazine if blood pressure was not better controlled, had not been placed on ACE inhibitor arm due to renal insufficiency on most recent labs. Follow-up BMET was ordered.  Labs: (Date 02/20/2017): Sodium 138; potassium 4.4; chloride 100; CO2 19; glucose 108; BUN 15; creatinine 0.91.   No further complaints of dizziness or lightheadedness. Blood pressure is well-controlled. She has had to stop taking atorvastatin as this caused a UTI. She states she time she has taken it this occurs. She was tolerant of pravastatin. This is going to be changed.  Past Medical History:  Diagnosis Date  . Back pain   . 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 CAD (coronary artery disease)    a. CABG in 2001 b. cath 12/29/16 - Successful PTCA/DES x 2 proximal body of SVG  to OM & PTCA/DES x 1 distal body of SVG to RCA.   . Diabetes mellitus without complication ([place])   . Hypertension   . PVD (peripheral vascular disease) ([place])    has stents in both legs    Past Surgical History:  Procedure Laterality Date  . cardiac bypass  2001   3v  . CORONARY STENT INTERVENTION N/A 12/29/2016   Procedure: CORONARY STENT INTERVENTION;  Surgeon: Burnell Blanks, MD;  Location: [facility];  Service: Cardiovascular;  Laterality: N/A;  . multiple toe amputations  2010-2017   3 toes on the right foot, 1 on the left  . nose removal  2004   basal cell carcinoma  . RIGHT/LEFT HEART CATH AND CORONARY/GRAFT ANGIOGRAPHY N/A 12/29/2016   Procedure: RIGHT/LEFT HEART CATH AND CORONARY/GRAFT ANGIOGRAPHY;  Surgeon: Burnell Blanks, MD;  Location: [facility];  Service: Cardiovascular;  Laterality: N/A;     Current Outpatient Prescriptions  Medication Sig Dispense Refill  . aspirin EC 81 MG tablet Take 81 mg by mouth daily.    . carvedilol (COREG) 6.25 MG tablet Take 1 tablet (6.25 mg total) by mouth 2 (two) times daily. 180 tablet 3  . Cholecalciferol (VITAMIN D3) 5000 units CAPS Take 1 capsule by mouth daily.    . insulin degludec (TRESIBA FLEXTOUCH) 100 UNIT/ML SOPN FlexTouch Pen Inject 2 Units into the skin daily at 10 pm.    . nicotine (NICODERM CQ - DOSED IN MG/24 HOURS) 14 mg/24hr patch Place  1 patch (14 mg total) onto the skin daily. 28 patch 0  . nitroGLYCERIN (NITROSTAT) 0.4 MG SL tablet Place 0.4 mg under the tongue every 5 (five) minutes as needed for chest pain.    . ticagrelor (BRILINTA) 90 MG TABS tablet Take 1 tablet (90 mg total) by mouth 2 (two) times daily. 60 tablet 6  . traMADol (ULTRAM) 50 MG tablet Take 50 mg by mouth every 6 (six) hours as needed for moderate pain or severe pain.    . dapagliflozin propanediol (FARXIGA) 5 MG TABS tablet Take 5 mg by mouth daily.    . pravastatin (PRAVACHOL) 80 MG tablet Take 1 tablet (80 mg total)  by mouth every evening. 30 tablet 6   No current facility-administered medications for this visit.     Allergies:   Nitroglycerin    Social History:  The patient  reports that she has been smoking Cigarettes.  She started smoking about 48 years ago. She has a 48.00 pack-year smoking history. She has never used smokeless tobacco. She reports that she does not drink alcohol or use drugs.   Family History:  The patient's family history includes CAD in her brother; CAD (age of onset: 74) in her mother; Diabetes in her sister; Hypertension in her sister.    ROS: All other systems are reviewed and negative. Unless otherwise mentioned in H&P    PHYSICAL EXAM: VS:  BP 128/78   Pulse 92   Ht 5\' 7"  (1.702 m)   Wt 180 lb (81.6 kg)   SpO2 96%   BMI 28.19 kg/m  , BMI Body mass index is 28.19 kg/m. GEN: Well nourished, well developed, in no acute distress  HEENT: normal  Neck: no JVD, carotid bruits, or masses Cardiac: RRR; no murmurs, rubs, or gallops,no edema  Respiratory:  Clear to auscultation bilaterally, normal work of breathing GI: soft, nontender, nondistended, + BS MS: no deformity or atrophy  Skin: warm and dry, no rash Neuro:  Strength and sensation are intact Psych: euthymic mood, full affect  Recent Labs: 12/27/2016: B Natriuretic Peptide 1,937.0 2017/01/01: TSH 1.353 12/30/2016: ALT 9 01/17/2017: Hemoglobin 14.8; Platelets 260 02/20/2017: BUN 15; Creatinine, Ser 0.91; Potassium 4.4; Sodium 138    Lipid Panel    Component Value Date/Time   CHOL 181 01/01/17 0632   TRIG 237 (H) 2017/01/01 0632   HDL 29 (L) 01-01-2017 0632   CHOLHDL 6.2 January 01, 2017 0632   VLDL 47 (H) 01-Jan-2017 0632   LDLCALC 105 (H) 2017-01-01 0632      Wt Readings from Last 3 Encounters:  03/17/17 180 lb (81.6 kg)  02/24/17 173 lb 3.2 oz (78.6 kg)  02/17/17 173 lb (78.5 kg)      Other studies Reviewed: Echocardiogram Jan 01, 2017 Left ventricle: Systolic function was severely reduced. The    estimated ejection fraction was in the range of 25% to 30%.   Diffuse hypokinesis. - Regional wall motion abnormality: Severe hypokinesis of the   apical septal, apical lateral, and apical myocardium. There no   clear evidence of apical thrombus. - Limited study with echocontrast to evaluate LV function and wall   motion.  ASSESSMENT AND PLAN:  1 Ischemic cardiomyopathy: Most recent ejection fraction 25-30%. Blood pressure is much better controlled with medication adjustments from last office visit. Is still not optimal, and we'll titrate up slowly. She is not a candidate for Entresto in the setting of chronic kidney disease. We'll need to increase carvedilol to 9.375 twice a day    2.  Coronary artery disease:  Patient has stents to the SVG to OM and SVG to RCA which was placed on 12/29/2016, and history of coronary artery bypass grafting in 2001 (three-vessel). She continues on carvedilol which will be increased to 9.375 twice a day, Brilinta 90 mg 3 times a day Patient is not a candidate for ACE inhibitor with chronic kidney disease.  3.  Hypercholesterolemia: Does not tolerate atorvastatin. She believes it causes UTI. When she stopped taking it she felt much better. I will change her to pravastatin 80 mg daily as she was able to tolerate that.  4.  Ongoing tobacco abuse: She has been counseled on smoking cessation, but unfortunately continues to smoke. She is aware that this increases her likelihood of another cardiac event.  Current medicines are reviewed at length with the patient today.    Labs/ tests ordered today include:   Phill Myron. West Pugh, ANP, AACC   03/17/2017 4:33 PM    [facility] [address] [place], [place] [address] Phone: [phone]; Fax: [phone]

## 2017-03-17 ENCOUNTER — Encounter: Payer: Self-pay | Admitting: Adult Health

## 2017-03-17 ENCOUNTER — Ambulatory Visit (INDEPENDENT_AMBULATORY_CARE_PROVIDER_SITE_OTHER): Payer: Medicaid Other | Admitting: Adult Health

## 2017-03-17 VITALS — BP 128/78 | HR 92 | Ht 67.0 in | Wt 180.0 lb

## 2017-03-17 DIAGNOSIS — I2510 Atherosclerotic heart disease of native coronary artery without angina pectoris: Secondary | ICD-10-CM

## 2017-03-17 DIAGNOSIS — I5022 Chronic systolic (congestive) heart failure: Secondary | ICD-10-CM

## 2017-03-17 DIAGNOSIS — I10 Essential (primary) hypertension: Secondary | ICD-10-CM

## 2017-03-17 DIAGNOSIS — Z951 Presence of aortocoronary bypass graft: Secondary | ICD-10-CM

## 2017-03-17 MED ORDER — PRAVASTATIN SODIUM 80 MG PO TABS: 80.0000 mg | ORAL_TABLET | Freq: Every evening | ORAL | 6 refills | Status: DC

## 2017-03-17 MED ORDER — CARVEDILOL 6.25 MG PO TABS: 9.7500 mg | ORAL_TABLET | Freq: Two times a day (BID) | ORAL | 3 refills | Status: DC

## 2017-03-17 NOTE — Addendum Note (Signed)
Addended by: Levonne Hubert on: 03/17/2017 04:57 PM   Modules accepted: Orders

## 2017-03-17 NOTE — Patient Instructions (Signed)
Medication Instructions:  Your physician has recommended you make the following change in your medication:  Stop Taking Lipitor  Start Taking Pravastatin 80 mg Daily    Labwork: NONE   Testing/Procedures: NONE   Follow-Up: Your physician wants you to follow-up in: 6 Months.  You will receive a reminder letter in the mail two months in advance. If you don't receive a letter, please call our office to schedule the follow-up appointment.   Any Other Special Instructions Will Be Listed Below (If Applicable).     If you need a refill on your cardiac medications before your next appointment, please call your pharmacy. Thank you for choosing [facility]!

## 2017-10-31 ENCOUNTER — Encounter: Payer: Self-pay | Admitting: Cardiology

## 2017-10-31 ENCOUNTER — Ambulatory Visit (INDEPENDENT_AMBULATORY_CARE_PROVIDER_SITE_OTHER): Payer: Medicare Other | Admitting: Cardiology

## 2017-10-31 VITALS — BP 100/67 | HR 80 | Ht 67.0 in | Wt 205.6 lb

## 2017-10-31 DIAGNOSIS — I10 Essential (primary) hypertension: Secondary | ICD-10-CM

## 2017-10-31 DIAGNOSIS — I739 Peripheral vascular disease, unspecified: Secondary | ICD-10-CM | POA: Diagnosis not present

## 2017-10-31 DIAGNOSIS — I5022 Chronic systolic (congestive) heart failure: Secondary | ICD-10-CM | POA: Diagnosis not present

## 2017-10-31 DIAGNOSIS — I2510 Atherosclerotic heart disease of native coronary artery without angina pectoris: Secondary | ICD-10-CM

## 2017-10-31 NOTE — Patient Instructions (Signed)
Your physician recommends that you schedule a follow-up appointment in: TO BE DETERMINED WITH DR [facility]  Your physician has recommended you make the following change in your medication:   START LASIX 20 MG DAILY AS NEEDED FOR SWELLING  PLEASE CALL OUR OFFICE WHEN YOU ARE READY FOR RX TO BE SENT TO PHARMACY   Your physician has requested that you have an echocardiogram. Echocardiography is a painless test that uses sound waves to create images of your heart. It provides your doctor with information about the size and shape of your heart and how well your heart's chambers and valves are working. This procedure takes approximately one hour. There are no restrictions for this procedure.  Thank you for choosing [facility]!!

## 2017-10-31 NOTE — Progress Notes (Signed)
Clinical Summary Tamara Carpenter is a 67 y.o.female seen today for follow up of the following medical problems   1. CAD/Chronic systolic heart failure - previously followed by Dr Dossie Der in [place], [place] - history of prior CABG in 2001 - admitted 12/2016 with signs of CHF, elevated trop to 0.54. - 12/2016 echo LVEF 25-30%, abnormal diastolic function, mild RV dysfunction - 12/2016 RHC/LHC: severe 3 vessel disease. 99% stenosis in SVG to OM, received DES x 2 and DES to SVG to RCA.   - no recent chest pain. Mild SOB at times she attributes to deconditioning due back pain. Reports some leg swelling.  - not checking weights at home. Mild occasional orthopnea - compliant with meds - has not been on ACE/ARB/ARNI/aldactone due to prior renal function. - she is off lasix due to prior AKI associated with GI illness. Renal function by last check 02/20/18 had normalized.   2. HTN - compliant with meds  3. PAD - prior interventions - no recent symptoms   Past Medical History:  Diagnosis Date  . Back pain   . 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 CAD (coronary artery disease)    a. CABG in 2001 b. cath 12/29/16 - Successful PTCA/DES x 2 proximal body of SVG to OM & PTCA/DES x 1 distal body of SVG to RCA.   . Diabetes mellitus without complication ([place])   . Hypertension   . PVD (peripheral vascular disease) ([place])    has stents in both legs     Allergies  Allergen Reactions  . Nitroglycerin     NOTE: Patient states that she can take the tablets but is not able to use the patches     Current Outpatient Medications  Medication Sig Dispense Refill  . aspirin EC 81 MG tablet Take 81 mg by mouth daily.    . carvedilol (COREG) 6.25 MG tablet Take 1.5 tablets (9.375 mg total) by mouth 2 (two) times daily. 270 tablet 3  . Cholecalciferol (VITAMIN D3) 5000 units CAPS Take 1 capsule by mouth daily.    . dapagliflozin propanediol (FARXIGA) 5 MG TABS tablet Take 5 mg  by mouth daily.    . insulin degludec (TRESIBA FLEXTOUCH) 100 UNIT/ML SOPN FlexTouch Pen Inject 2 Units into the skin daily at 10 pm.    . nicotine (NICODERM CQ - DOSED IN MG/24 HOURS) 14 mg/24hr patch Place 1 patch (14 mg total) onto the skin daily. 28 patch 0  . nitroGLYCERIN (NITROSTAT) 0.4 MG SL tablet Place 0.4 mg under the tongue every 5 (five) minutes as needed for chest pain.    . pravastatin (PRAVACHOL) 80 MG tablet Take 1 tablet (80 mg total) by mouth every evening. 30 tablet 6  . ticagrelor (BRILINTA) 90 MG TABS tablet Take 1 tablet (90 mg total) by mouth 2 (two) times daily. 60 tablet 6  . traMADol (ULTRAM) 50 MG tablet Take 50 mg by mouth every 6 (six) hours as needed for moderate pain or severe pain.     No current facility-administered medications for this visit.      Past Surgical History:  Procedure Laterality Date  . cardiac bypass  2001   3v  . CORONARY STENT INTERVENTION N/A 12/29/2016   Procedure: CORONARY STENT INTERVENTION;  Surgeon: Burnell Blanks, MD;  Location: [facility];  Service: Cardiovascular;  Laterality: N/A;  . multiple toe amputations  2010-2017   3 toes on  the right foot, 1 on the left  . nose removal  2004   basal cell carcinoma  . RIGHT/LEFT HEART CATH AND CORONARY/GRAFT ANGIOGRAPHY N/A 12/29/2016   Procedure: RIGHT/LEFT HEART CATH AND CORONARY/GRAFT ANGIOGRAPHY;  Surgeon: Burnell Blanks, MD;  Location: [facility];  Service: Cardiovascular;  Laterality: N/A;     Allergies  Allergen Reactions  . Nitroglycerin     NOTE: Patient states that she can take the tablets but is not able to use the patches      Family History  Problem Relation Age of Onset  . CAD Mother 42  . Diabetes Sister   . Hypertension Sister   . CAD Brother      Social History Ms. Cicio reports that she has been smoking cigarettes.  She started smoking about 49 years ago. She has a 48.00 pack-year smoking history. She has never used  smokeless tobacco. Ms. Safranek reports that she does not drink alcohol.   Review of Systems CONSTITUTIONAL: No weight loss, fever, chills, weakness or fatigue.  HEENT: Eyes: No visual loss, blurred vision, double vision or yellow sclerae.No hearing loss, sneezing, congestion, runny nose or sore throat.  SKIN: No rash or itching.  CARDIOVASCULAR: per hpi RESPIRATORY: per hpi GASTROINTESTINAL: No anorexia, nausea, vomiting or diarrhea. No abdominal pain or blood.  GENITOURINARY: No burning on urination, no polyuria NEUROLOGICAL: No headache, dizziness, syncope, paralysis, ataxia, numbness or tingling in the extremities. No change in bowel or bladder control.  MUSCULOSKELETAL: No muscle, back pain, joint pain or stiffness.  LYMPHATICS: No enlarged nodes. No history of splenectomy.  PSYCHIATRIC: No history of depression or anxiety.  ENDOCRINOLOGIC: No reports of sweating, cold or heat intolerance. No polyuria or polydipsia.  Marland Kitchen   Physical Examination Vitals:   10/31/17 1120  BP: 100/67  Pulse: 80  SpO2: 99%   Vitals:   10/31/17 1120  Weight: 205 lb 9.6 oz (93.3 kg)  Height: 5\' 7"  (1.702 m)    Gen: resting comfortably, no acute distress HEENT: no scleral icterus, pupils equal round and reactive, no palptable cervical adenopathy,  CV: RRR, no m/r/g, no jvd Resp: Clear to auscultation bilaterally GI: abdomen is soft, non-tender, non-distended, normal bowel sounds, no hepatosplenomegaly MSK: extremities are warm, no edema.  Skin: warm, no rash Neuro:  no focal deficits Psych: appropriate affect   Diagnostic Studies  12/2016 RHC/LHC  SVG graft was visualized by angiography.  Ost RCA to Prox RCA lesion, 100 %stenosed.  Prox Cx to Mid Cx lesion, 100 %stenosed.  SVG graft was visualized by angiography.  A STENT RESOLUTE ONYX 3.5X18 drug eluting stent was successfully placed.  Dist Graft lesion, 99 %stenosed.  Post intervention, there is a 0% residual stenosis.  Prox  Graft lesion, 99 %stenosed.  Post intervention, there is a 0% residual stenosis.  A STENT RESOLUTE ONYX 3.5X12 drug eluting stent was successfully placed.  Prox LAD to Mid LAD lesion, 99 %stenosed.  LIMA graft was visualized by angiography.  LV end diastolic pressure is normal.  Mid LAD to Dist LAD lesion, 30 %stenosed.   1. Severe triple vessel CAD s/p 3V CABG with 3/3 patent bypass grafts 2. The mid LAD has high grade stenosis. The mid and distal LAD fills from the patent LIMA graft 3. The Circumflex is occluded in the mid segment. The OM fills from the patent vein graft. The proximal body of the vein graft has a 99% stenosis.  4. Successful PTCA/DES x 2 proximal body of SVG to  OM 5. The RCA is proximally occluded. The vein graft to the distal RCA has a 99% stenosis.  6. Successful PTCA/DES x 1 distal body of SVG to RCA  Recommendations: DAPT with ASA and Brilinta for one year. Continue statin and beta blocker  12/2016 echo Study Conclusions  - Left ventricle: The cavity size was normal. Wall thickness was   increased in a pattern of moderate LVH. Systolic function was   severely reduced. The estimated ejection fraction was in the   range of 25% to 30%. Doppler parameters are consistent with   restrictive physiology, indicative of decreased left ventricular   diastolic compliance and/or increased left atrial pressure.   Doppler parameters are consistent with high ventricular filling   pressure. - Aortic valve: Valve area (VTI): 2.99 cm^2. Valve area (Vmax):   2.53 cm^2. Valve area (Vmean): 2.95 cm^2. - Mitral valve: There was mild regurgitation. - Left atrium: The atrium was severely dilated. - Right ventricle: Systolic function was mildly reduced. - Right atrium: The atrium was mildly dilated. - Inferior vena cava: The vessel was dilated. The respirophasic   diameter changes were blunted (< 50%), consistent with elevated   central venous pressure. - Technically  difficult study. Consider limited contrast study to   more precisely describe her LV function and wall motion.   Assessment and Plan   1. CAD - no recent chest pain, continue current meds - EKG today shows SR and chronic nonspecific conduction delay, no ischemic changes  2. Chronic systolic HF - reports some recent swelling, SOB, orthopnea - we will obtian echo to see if LVEF change after prior revasc and ongoing medical therapy - start lasix 20mg  daily prn  3. PAD - no symptoms, conitnue current meds  F/u pending echo results     Arnoldo Lenis, M.D.

## 2017-11-05 ENCOUNTER — Encounter: Payer: Self-pay | Admitting: Cardiology

## 2017-11-13 ENCOUNTER — Other Ambulatory Visit: Payer: Self-pay | Admitting: Cardiology

## 2017-11-13 MED ORDER — NITROGLYCERIN 0.4 MG SL SUBL: 0.4000 mg | SUBLINGUAL_TABLET | SUBLINGUAL | 3 refills | Status: AC | PRN

## 2017-11-13 MED ORDER — FUROSEMIDE 20 MG PO TABS: 20.0000 mg | ORAL_TABLET | Freq: Every day | ORAL | 3 refills | Status: DC | PRN

## 2017-11-13 NOTE — Telephone Encounter (Signed)
Patient called asking for her meds to be sent into   [place], [place]

## 2017-11-13 NOTE — Telephone Encounter (Signed)
Noted  

## 2017-11-23 ENCOUNTER — Other Ambulatory Visit: Payer: Self-pay

## 2017-11-23 ENCOUNTER — Ambulatory Visit (INDEPENDENT_AMBULATORY_CARE_PROVIDER_SITE_OTHER): Payer: Medicare Other

## 2017-11-23 ENCOUNTER — Encounter

## 2017-11-23 DIAGNOSIS — I5022 Chronic systolic (congestive) heart failure: Secondary | ICD-10-CM

## 2017-12-07 ENCOUNTER — Telehealth: Payer: Self-pay | Admitting: *Deleted

## 2017-12-07 NOTE — Telephone Encounter (Signed)
Tamara Lenis, MD  Laurine Blazer, LPN        2 months   JB

## 2017-12-07 NOTE — Telephone Encounter (Signed)
Notes recorded by Laurine Blazer, LPN on 01/07/8866 at 7:37 PM EDT Patient notified. Copy to pmd. OV scheduled for 02/19/2018 with Dr. Harl Bowie in [place] office.

## 2017-12-07 NOTE — Telephone Encounter (Signed)
Notes recorded by Laurine Blazer, LPN on 06/03/3792 at 3:27 PM EDT Patient notified. Stated she is feeling better. Taking her Lasix about 2-3 x per week. When do you want to see her back for follow up? ------  Notes recorded by Acquanetta Chain, LPN on 10/29/4707 at 29:57 AM EDT LMTCB ------  Notes recorded by Arnoldo Lenis, MD on 11/29/2017 at 1:05 PM EDT Echo shows pumping function midly improved to 35% (normal is 50-60%, she had been 25-30%). How is her breathing doing since starting the fluid pill?  Zandra Abts MD

## 2017-12-14 ENCOUNTER — Telehealth: Payer: Self-pay | Admitting: Vascular Surgery

## 2017-12-14 ENCOUNTER — Emergency Department (HOSPITAL_COMMUNITY): Payer: Medicare Other

## 2017-12-14 ENCOUNTER — Encounter (HOSPITAL_COMMUNITY): Payer: Self-pay

## 2017-12-14 ENCOUNTER — Emergency Department (HOSPITAL_COMMUNITY)
Admission: EM | Admit: 2017-12-14 | Discharge: 2017-12-14 | Disposition: A | Discharge status: Home / Self Care | Payer: Medicare Other | Attending: Emergency Medicine | Admitting: Emergency Medicine

## 2017-12-14 ENCOUNTER — Other Ambulatory Visit: Payer: Self-pay

## 2017-12-14 DIAGNOSIS — I5022 Chronic systolic (congestive) heart failure: Secondary | ICD-10-CM | POA: Insufficient documentation

## 2017-12-14 DIAGNOSIS — Z794 Long term (current) use of insulin: Secondary | ICD-10-CM | POA: Diagnosis not present

## 2017-12-14 DIAGNOSIS — R1012 Left upper quadrant pain: Secondary | ICD-10-CM | POA: Diagnosis not present

## 2017-12-14 DIAGNOSIS — Z7982 Long term (current) use of aspirin: Secondary | ICD-10-CM | POA: Diagnosis not present

## 2017-12-14 DIAGNOSIS — I110 Hypertensive heart disease with heart failure: Secondary | ICD-10-CM | POA: Diagnosis not present

## 2017-12-14 DIAGNOSIS — E119 Type 2 diabetes mellitus without complications: Secondary | ICD-10-CM | POA: Diagnosis not present

## 2017-12-14 DIAGNOSIS — I2510 Atherosclerotic heart disease of native coronary artery without angina pectoris: Secondary | ICD-10-CM | POA: Diagnosis not present

## 2017-12-14 DIAGNOSIS — R1013 Epigastric pain: Secondary | ICD-10-CM | POA: Insufficient documentation

## 2017-12-14 DIAGNOSIS — R112 Nausea with vomiting, unspecified: Secondary | ICD-10-CM

## 2017-12-14 DIAGNOSIS — F17210 Nicotine dependence, cigarettes, uncomplicated: Secondary | ICD-10-CM | POA: Diagnosis not present

## 2017-12-14 DIAGNOSIS — Z79899 Other long term (current) drug therapy: Secondary | ICD-10-CM | POA: Diagnosis not present

## 2017-12-14 DIAGNOSIS — R109 Unspecified abdominal pain: Secondary | ICD-10-CM | POA: Diagnosis present

## 2017-12-14 LAB — URINALYSIS, ROUTINE W REFLEX MICROSCOPIC
Bilirubin Urine: NEGATIVE
Glucose, UA: NEGATIVE mg/dL
KETONES UR: 5 mg/dL — AB
Leukocytes, UA: NEGATIVE
NITRITE: NEGATIVE
Protein, ur: NEGATIVE mg/dL
SPECIFIC GRAVITY, URINE: 1.013 (ref 1.005–1.030)
pH: 5 (ref 5.0–8.0)

## 2017-12-14 LAB — COMPREHENSIVE METABOLIC PANEL
ALT: 11 U/L (ref 0–44)
AST: 14 U/L — ABNORMAL LOW (ref 15–41)
Albumin: 3.3 g/dL — ABNORMAL LOW (ref 3.5–5.0)
Alkaline Phosphatase: 75 U/L (ref 38–126)
Anion gap: 11 (ref 5–15)
BUN: 27 mg/dL — ABNORMAL HIGH (ref 8–23)
CALCIUM: 8.9 mg/dL (ref 8.9–10.3)
CHLORIDE: 101 mmol/L (ref 98–111)
CO2: 22 mmol/L (ref 22–32)
Creatinine, Ser: 1.34 mg/dL — ABNORMAL HIGH (ref 0.44–1.00)
GFR, EST AFRICAN AMERICAN: 47 mL/min — AB (ref >60)
GFR, EST NON AFRICAN AMERICAN: 41 mL/min — AB (ref >60)
Glucose, Bld: 207 mg/dL — ABNORMAL HIGH (ref 70–99)
Potassium: 4 mmol/L (ref 3.5–5.1)
Sodium: 134 mmol/L — ABNORMAL LOW (ref 135–145)
Total Bilirubin: 0.6 mg/dL (ref 0.3–1.2)
Total Protein: 7 g/dL (ref 6.5–8.1)

## 2017-12-14 LAB — CBC WITH DIFFERENTIAL/PLATELET
BASOS PCT: 0 %
Basophils Absolute: 0 10*3/uL (ref 0.0–0.1)
Eosinophils Absolute: 0.4 10*3/uL (ref 0.0–0.7)
Eosinophils Relative: 3 %
HEMATOCRIT: 40.9 % (ref 36.0–46.0)
Hemoglobin: 14.1 g/dL (ref 12.0–15.0)
LYMPHS PCT: 19 %
Lymphs Abs: 2.4 10*3/uL (ref 0.7–4.0)
MCH: 30.9 pg (ref 26.0–34.0)
MCHC: 34.5 g/dL (ref 30.0–36.0)
MCV: 89.7 fL (ref 78.0–100.0)
Monocytes Absolute: 1 10*3/uL (ref 0.1–1.0)
Monocytes Relative: 8 %
NEUTROS ABS: 9 10*3/uL — AB (ref 1.7–7.7)
NEUTROS PCT: 70 %
Platelets: 264 10*3/uL (ref 150–400)
RBC: 4.56 MIL/uL (ref 3.87–5.11)
RDW: 13.5 % (ref 11.5–15.5)
WBC: 12.8 10*3/uL — AB (ref 4.0–10.5)

## 2017-12-14 LAB — TROPONIN I: Troponin I: <0.03 ng/mL (ref <0.03)

## 2017-12-14 LAB — LIPASE, BLOOD: LIPASE: 47 U/L (ref 11–51)

## 2017-12-14 MED ORDER — FAMOTIDINE 20 MG PO TABS: 20.0000 mg | ORAL_TABLET | Freq: Two times a day (BID) | ORAL | 0 refills | Status: DC

## 2017-12-14 MED ORDER — IOPAMIDOL (ISOVUE-370) INJECTION 76%
60.0000 mL | Freq: Once | INTRAVENOUS | Status: AC | PRN
  Administered 2017-12-14: 60 mL via INTRAVENOUS

## 2017-12-14 MED ORDER — ONDANSETRON 4 MG PO TBDP: 4.0000 mg | ORAL_TABLET | Freq: Three times a day (TID) | ORAL | 0 refills | Status: DC | PRN

## 2017-12-14 MED ORDER — ONDANSETRON HCL 4 MG/2ML IJ SOLN: 4.0000 mg | INTRAMUSCULAR | Status: DC | PRN

## 2017-12-14 MED ORDER — FAMOTIDINE IN NACL 20-0.9 MG/50ML-% IV SOLN
20.0000 mg | Freq: Once | INTRAVENOUS | Status: AC
  Administered 2017-12-14: 20 mg via INTRAVENOUS
  Filled 2017-12-14: qty 50

## 2017-12-14 MED ORDER — SODIUM CHLORIDE 0.9 % IV BOLUS
250.0000 mL | Freq: Once | INTRAVENOUS | Status: AC
  Administered 2017-12-14: 250 mL via INTRAVENOUS

## 2017-12-14 MED ORDER — SODIUM CHLORIDE 0.9 % IV SOLN
INTRAVENOUS | Status: DC
  Administered 2017-12-14: 11:00:00 via INTRAVENOUS

## 2017-12-14 NOTE — ED Provider Notes (Signed)
[facility] EMERGENCY DEPARTMENT Provider Note   CSN: [id number] Arrival date & time: 12/14/17  1011     History   Chief Complaint Chief Complaint  Patient presents with  . Abdominal Pain    HPI Tamara Carpenter is a 67 y.o. female.  HPI  Pt was seen at 1025.  Per pt, c/o gradual onset and persistence of constant left sided abd "pain" for the past 4 to 5 days. Has been associated with multiple intermittent episodes of N/V daily.  Describes the abd pain as "sharp"  Pt states the pain will wax and wane, but never has gone away completely since onset. Denies diarrhea, no fevers, no back pain, no rash, no CP/SOB, no black or blood in stools or emesis.      Past Medical History:  Diagnosis Date  . Back pain   . 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 CAD (coronary artery disease)    a. CABG in 2001 b. cath 12/29/16 - Successful PTCA/DES x 2 proximal body of SVG to OM & PTCA/DES x 1 distal body of SVG to RCA.   . Diabetes mellitus without complication ([place])   . Hypertension   . PVD (peripheral vascular disease) ([place])    has stents in both legs    Patient Active Problem List   Diagnosis Date Noted  . Hypotension 01/17/2017  . CAD S/P percutaneous coronary angioplasty 01/17/2017  . Cardiomyopathy, ischemic 01/17/2017  . Status post coronary artery stent placement   . Chronic systolic heart failure ([place])   . Acute heart failure ([place])   . Elevated troponin   . CHF exacerbation ([place]) 12/27/2016  . NSTEMI (non-ST elevated myocardial infarction) ([place]) 12/27/2016  . Diabetes mellitus type 2 in obese ([place]) 12/27/2016  . Tobacco dependence 12/27/2016  . Essential hypertension 12/27/2016  . S/P CABG x 3 12/27/2016  . PVD (peripheral vascular disease) ([place]) 12/27/2016    Past Surgical History:  Procedure Laterality Date  . cardiac bypass  2001   3v  . CORONARY STENT INTERVENTION N/A 12/29/2016   Procedure: CORONARY STENT INTERVENTION;  Surgeon:  Burnell Blanks, MD;  Location: [facility];  Service: Cardiovascular;  Laterality: N/A;  . multiple toe amputations  2010-2017   3 toes on the right foot, 1 on the left  . nose removal  2004   basal cell carcinoma  . RIGHT/LEFT HEART CATH AND CORONARY/GRAFT ANGIOGRAPHY N/A 12/29/2016   Procedure: RIGHT/LEFT HEART CATH AND CORONARY/GRAFT ANGIOGRAPHY;  Surgeon: Burnell Blanks, MD;  Location: [facility];  Service: Cardiovascular;  Laterality: N/A;     OB History   None      Home Medications    Prior to Admission medications   Medication Sig Start Date End Date Taking? Authorizing Provider  aspirin EC 81 MG tablet Take 81 mg by mouth daily.    [provider]  carvedilol (COREG) 6.25 MG tablet Take 1.5 tablets (9.375 mg total) by mouth 2 (two) times daily. 03/17/17   Lendon Colonel, NP  Cholecalciferol (VITAMIN D3) 5000 units CAPS Take 1 capsule by mouth daily.    [provider]  furosemide (LASIX) 20 MG tablet Take 1 tablet (20 mg total) by mouth daily as needed. 11/13/17   Arnoldo Lenis, MD  insulin degludec (TRESIBA FLEXTOUCH) 100 UNIT/ML SOPN FlexTouch Pen Inject 20 Units into the skin daily at 10 pm.     [provider]  lisinopril-hydrochlorothiazide (PRINZIDE,ZESTORETIC) 20-12.5 MG tablet Take 1 tablet by mouth daily. 11/13/17   [provider]  losartan (COZAAR) 100 MG tablet Take 100 mg by mouth daily.    [provider]  Multiple Vitamin (MULTIVITAMIN) tablet Take 1 tablet by mouth daily.    [provider]  nitroGLYCERIN (NITROSTAT) 0.4 MG SL tablet Place 1 tablet (0.4 mg total) under the tongue every 5 (five) minutes as needed for chest pain. 11/13/17   Arnoldo Lenis, MD  pravastatin (PRAVACHOL) 80 MG tablet Take 1 tablet (80 mg total) by mouth every evening. 03/17/17 11/01/18  Lendon Colonel, NP  ticagrelor (BRILINTA) 90 MG TABS tablet Take 1 tablet (90 mg total) by mouth 2 (two)  times daily. 01/17/17   Erlene Quan, PA-C  traMADol (ULTRAM) 50 MG tablet Take 50 mg by mouth every 6 (six) hours as needed for moderate pain or severe pain.    [provider]  Vitamin D, Ergocalciferol, (DRISDOL) 50000 units CAPS capsule Take 1 capsule by mouth once a week. 11/13/17   [provider]    Family History Family History  Problem Relation Age of Onset  . CAD Mother 102  . Diabetes Sister   . Hypertension Sister   . CAD Brother     Social History Social History   Tobacco Use  . Smoking status: Heavy Tobacco Smoker    Packs/day: 0.50    Years: 48.00    Pack years: 24.00    Types: Cigarettes    Start date: 09/28/1968  . Smokeless tobacco: Never Used  Substance Use Topics  . Alcohol use: No  . Drug use: No     Allergies   Nitroglycerin   Review of Systems Review of Systems ROS: Statement: All systems negative except as marked or noted in the HPI; Constitutional: Negative for fever and chills. ; ; Eyes: Negative for eye pain, redness and discharge. ; ; ENMT: Negative for ear pain, hoarseness, nasal congestion, sinus pressure and sore throat. ; ; Cardiovascular: Negative for chest pain, palpitations, diaphoresis, dyspnea and peripheral edema. ; ; Respiratory: Negative for cough, wheezing and stridor. ; ; Gastrointestinal: +N/V, abd pain. Negative for diarrhea, blood in stool, hematemesis, jaundice and rectal bleeding. . ; ; Genitourinary: Negative for dysuria, flank pain and hematuria. ; ; Musculoskeletal: Negative for back pain and neck pain. Negative for swelling and trauma.; ; Skin: Negative for pruritus, rash, abrasions, blisters, bruising and skin lesion.; ; Neuro: Negative for headache, lightheadedness and neck stiffness. Negative for weakness, altered level of consciousness, altered mental status, extremity weakness, paresthesias, involuntary movement, seizure and syncope.       Physical Exam Updated Vital Signs BP (!) 146/72 (BP Location:  Left Arm)   Pulse (!) 104   Temp 97.8 F (36.6 C) (Oral)   Resp 16   Wt 93 kg (205 lb)   SpO2 96%   BMI 32.11 kg/m   Physical Exam 1030: Physical examination:  Nursing notes reviewed; Vital signs and O2 SAT reviewed;  Constitutional: Well developed, Well nourished, Well hydrated, In no acute distress; Head:  Normocephalic, atraumatic; Eyes: EOMI, PERRL, No scleral icterus; ENMT: Mouth and pharynx normal, Mucous membranes moist; Neck: Supple, Full range of motion, No lymphadenopathy; Cardiovascular: Regular rate and rhythm, No gallop; Respiratory: Breath sounds clear & equal bilaterally, No wheezes.  Speaking full sentences with ease, Normal respiratory effort/excursion; Chest: Nontender, Movement normal; Abdomen: Soft, +LUQ and mid-epigastric tenderness to palp. No rebound or guarding. Nondistended, Normal bowel sounds;  Genitourinary: No CVA tenderness; Extremities: Peripheral pulses normal, No tenderness, No edema, No calf edema or asymmetry.; Neuro: AA&Ox3, Major CN grossly intact.  Speech clear. No gross focal motor or sensory deficits in extremities.; Skin: Color normal, Warm, Dry.   ED Treatments / Results  Labs (all labs ordered are listed, but only abnormal results are displayed)   EKG EKG Interpretation  Date/Time:  Thursday December 14 2017 10:29:43 EDT Ventricular Rate:  95 PR Interval:    QRS Duration: 147 QT Interval:  385 QTC Calculation: 484 R Axis:   -65 Text Interpretation:  Sinus rhythm Left bundle branch block Baseline wander When compared with ECG of 10/31/2017 No significant change was found Confirmed by Francine Graven [phone]) on 12/14/2017 10:36:49 AM   Radiology   Procedures Procedures (including critical care time)  Medications Ordered in ED Medications  ondansetron (ZOFRAN) injection 4 mg (has no administration in time range)  famotidine (PEPCID) IVPB 20 mg premix (has no administration in time range)  0.9 %  sodium chloride infusion (has no  administration in time range)  sodium chloride 0.9 % bolus 250 mL (has no administration in time range)     Initial Impression / Assessment and Plan / ED Course  I have reviewed the triage vital signs and the nursing notes.  Pertinent labs & imaging results that were available during my care of the patient were reviewed by me and considered in my medical decision making (see chart for details).  MDM Reviewed: previous chart, nursing note and vitals Reviewed previous: labs and ECG Interpretation: labs, ECG, x-ray and CT scan    Results for orders placed or performed during the hospital encounter of 12/14/17  Lipase, blood  Result Value Ref Range   Lipase 47 11 - 51 U/L  Comprehensive metabolic panel  Result Value Ref Range   Sodium 134 (L) 135 - 145 mmol/L   Potassium 4.0 3.5 - 5.1 mmol/L   Chloride 101 98 - 111 mmol/L   CO2 22 22 - 32 mmol/L   Glucose, Bld 207 (H) 70 - 99 mg/dL   BUN 27 (H) 8 - 23 mg/dL   Creatinine, Ser 1.34 (H) 0.44 - 1.00 mg/dL   Calcium 8.9 8.9 - 10.3 mg/dL   Total Protein 7.0 6.5 - 8.1 g/dL   Albumin 3.3 (L) 3.5 - 5.0 g/dL   AST 14 (L) 15 - 41 U/L   ALT 11 0 - 44 U/L   Alkaline Phosphatase 75 38 - 126 U/L   Total Bilirubin 0.6 0.3 - 1.2 mg/dL   GFR calc non Af Amer 41 (L) >60 mL/min   GFR calc Af Amer 47 (L) >60 mL/min   Anion gap 11 5 - 15  Urinalysis, Routine w reflex microscopic  Result Value Ref Range   Color, Urine YELLOW YELLOW   APPearance HAZY (A) CLEAR   Specific Gravity, Urine 1.013 1.005 - 1.030   pH 5.0 5.0 - 8.0   Glucose, UA NEGATIVE NEGATIVE mg/dL   Hgb urine dipstick SMALL (A) NEGATIVE   Bilirubin Urine NEGATIVE NEGATIVE   Ketones, ur 5 (A) NEGATIVE mg/dL   Protein, ur NEGATIVE NEGATIVE mg/dL   Nitrite NEGATIVE NEGATIVE   Leukocytes, UA NEGATIVE NEGATIVE   RBC / HPF 0-5 0 - 5 RBC/hpf   WBC, UA 0-5 0 - 5 WBC/hpf   Bacteria, UA FEW (A) NONE SEEN   Squamous Epithelial / LPF 0-5 0 - 5   Mucus PRESENT    Hyaline Casts, UA  PRESENT   CBC with Differential/Platelet  Result Value Ref Range   WBC 12.8 (H) 4.0 - 10.5 K/uL   RBC 4.56 3.87 - 5.11 MIL/uL   Hemoglobin 14.1 12.0 - 15.0 g/dL   HCT 40.9 36.0 - 46.0 %   MCV 89.7 78.0 - 100.0 fL   MCH 30.9 26.0 - 34.0 pg   MCHC 34.5 30.0 - 36.0 g/dL   RDW 13.5 11.5 - 15.5 %   Platelets 264 150 - 400 K/uL   Neutrophils Relative % 70 %   Neutro Abs 9.0 (H) 1.7 - 7.7 K/uL   Lymphocytes Relative 19 %   Lymphs Abs 2.4 0.7 - 4.0 K/uL   Monocytes Relative 8 %   Monocytes Absolute 1.0 0.1 - 1.0 K/uL   Eosinophils Relative 3 %   Eosinophils Absolute 0.4 0.0 - 0.7 K/uL   Basophils Relative 0 %   Basophils Absolute 0.0 0.0 - 0.1 K/uL  Troponin I  Result Value Ref Range   Troponin I <0.03 <0.03 ng/mL   Ct Abdomen Pelvis Wo Contrast Result Date: 12/14/2017 CLINICAL DATA:  67 year old with pain under the left breast for 4-5 days. Vomiting. EXAM: CT ABDOMEN AND PELVIS WITHOUT CONTRAST TECHNIQUE: Multidetector CT imaging of the abdomen and pelvis was performed following the standard protocol without IV contrast. COMPARISON:  Chest radiograph 12/14/2017 FINDINGS: Lower chest: Few densities at the medial left lung base are probably related to scar or atelectasis. No large pleural effusions. Coronary artery calcifications. Hepatobiliary: Gallbladder appears to be decompressed with small calcified densities. Findings are suggestive for a contracted gallbladder with stones. No inflammatory changes in the right upper abdomen. No gross abnormality to the liver on this noncontrast examination. No biliary dilatation. Pancreas: Unremarkable. No pancreatic ductal dilatation or surrounding inflammatory changes. Spleen: Normal in size without focal abnormality. Adrenals/Urinary Tract: Normal adrenal glands. Marked atrophy involving the right kidney lower pole with some perinephric densities probably representing chronic changes. Probable small cyst involving the right kidney lower pole. Scattered  calcifications in the right kidney could be vascular. There is an extrarenal pelvis involving the right kidney without definite hydronephrosis. Urinary bladder is unremarkable. No evidence for ureter stones. Calcifications iin left kidney probably represent a combination of vascular calcifications and nonobstructive stones. Mild dilatation of the left renal pelvis without hydronephrosis. Stomach/Bowel: Normal appearance of the stomach and duodenum. Diverticulosis involving the sigmoid colon without acute colonic inflammation. Scattered colonic diverticula throughout the abdomen. Normal appendix. No evidence for bowel obstruction. Vascular/Lymphatic: Infrarenal abdominal aortic aneurysm measuring up to 4.6 cm. Concern for an eccentric saccular aneurysm along the right side of the infrarenal abdominal aorta on sequence 2, image 35. This roughly measures up to 1.3 cm. However, this may represent adjacent lymph node tissue with calcifications. Right common iliac artery measures up to 1.9 cm. The iliac arteries are heavily calcified. Multiple small lymph nodes in the periaortic region and difficult to differentiate lymph node versus saccular aneurysm along the right infrarenal abdominal aorta. Overall, no significant lymph node enlargement. Reproductive: Uterus and bilateral adnexa are unremarkable. Other: Negative for free fluid. Negative for free air. Soft tissue thickening posterior to the sacrum and coccyx. Findings could be related to an old decubitus ulceration. Musculoskeletal: Lucency in both femoral necks probably related to osteopenia. 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 Disc space narrowing with vacuum disc phenomena at L3-L4. Chronic disc space loss at T11-T12. Chronic disc space loss at T8-T9. IMPRESSION: Bilateral renal calcifications. Majority of the calcifications are suggestive for  vascular calcifications but cannot exclude nonobstructive stones, particularly in the left kidney. No  hydronephrosis and no evidence for ureter stones. Abdominal aortic aneurysm measuring up to 4.6 cm. Cannot exclude an eccentric saccular aneurysm along the right side of the infrarenal abdominal aorta measuring up to 1.3 cm. A follow-up CTA of the abdomen and pelvis will be useful to further characterize the abdominal aorta. Gallbladder appears to be contracted with stones. No inflammatory changes around the gallbladder. Question decubitus ulceration, probably chronic or healed. Electronically Signed   By: Markus Daft M.D.   On: 12/14/2017 12:21   Dg Chest 2 View Result Date: 12/14/2017 CLINICAL DATA:  Left abdominal pain.  Current smoker. EXAM: CHEST - 2 VIEW COMPARISON:  12/27/2016. FINDINGS: Prior median sternotomy. Multiple broken sternal wires are again noted. Heart size is within normal limits. Lungs are clear without airspace disease or pulmonary edema. Old fracture with residual lucency in the proximal left humerus and these findings are unchanged. Compression deformities in the midthoracic spine are unchanged. IMPRESSION: No active cardiopulmonary disease. Old fracture involving the proximal left humerus and suspect nonunion in this region. Electronically Signed   By: Markus Daft M.D.   On: 12/14/2017 11:58   Ct Angio Abd/pel W And/or Wo Contrast Result Date: 12/14/2017 CLINICAL DATA:  Pt brought in by EMS due to pain under left breast for 4-5 days. Vomiting daily. Normal BMs. Pain is intermittent. Worse in the morning and better in pm. Aortic aneurysm. Vascular calcifications. EXAM: CT ANGIOGRAPHY ABDOMEN AND PELVIS WITH CONTRAST AND WITHOUT CONTRAST TECHNIQUE: Multidetector CT imaging of the abdomen and pelvis was performed using the standard protocol during bolus administration of intravenous contrast. Multiplanar reconstructed images and MIPs were obtained and reviewed to evaluate the vascular anatomy. CONTRAST:  1mL ISOVUE-370 IOPAMIDOL (ISOVUE-370) INJECTION 76% COMPARISON:  Noncontrast CT from  earlier the same day FINDINGS: VASCULAR Aorta: Scattered atheromatous calcified plaque throughout. Eccentric nonocclusive mural thrombus in the visualized distal descending thoracic and suprarenal segments. The suprarenal segment is ectatic up to 3.1 cm diameter. Fusiform dilatation of the infrarenal segment up to 4.6 x 4.5 cm maximum transverse dimensions. Penetrating atheromatous ulcer with 1.4 cm largely thrombosed saccular extension from the right posterolateral infrarenal aorta. Smaller 0.6 cm saccular extension from the left anterolateral aspect more inferiorly. No evidence of leak. The aorta tapers to a diameter of 2.6 cm above the carina. No dissection or stenosis. Celiac: Patent without evidence of aneurysm, dissection, vasculitis or significant stenosis. The left gastric artery arises directly from the aorta, an anatomic variant. SMA: Partially calcified eccentric ostial plaque extending over length of approximately 3 cm without high-grade stenosis. Replaced right hepatic arterial supply, an anatomic variant. Renals: Right renal artery high-grade ostial stenosis involving a length of approximately 1 cm, patent distally. Eccentric left renal artery partially calcified ostial plaque resulting in at least mild stenosis, patent distally. IMA: Origin occlusion, reconstituted distally by visceral collaterals. Inflow: Right common iliac artery ectatic up to 2 cm diameter, left 1.6 cm. 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 External iliac arteries are atheromatous but patent. No severe tortuosity. Proximal Outflow: Patent. Atheromatous plaque involving proximal superficial femoral arteries of indeterminate hemodynamic significance. Veins: Patent hepatic veins, portal vein, SMV, splenic vein, renal veins, IVC. Retroaortic left renal vein, an anatomic variant. Dilated refluxing left gonadal vein. Review of the MIP images confirms the above findings. NON-VASCULAR Lower chest: Coronary calcifications.  Previous median sternotomy and CABG. No pleural or pericardial effusion. Visualized lung bases clear. Hepatobiliary:  Contracted gallbladder containing partially calcified subcentimeter stones. No biliary ductal dilatation. No focal liver lesion. Pancreas: Unremarkable. No pancreatic ductal dilatation or surrounding inflammatory changes. Spleen: Normal in size without focal abnormality. Adrenals/Urinary Tract: Normal adrenals. 6 mm calculus in lower pole left renal collecting system. No hydronephrosis. Focal renal loss in the lower pole right kidney, with a probable 1.1 cm cyst. Urinary bladder incompletely distended. Stomach/Bowel: Stomach is nondistended. Small bowel decompressed. Normal appendix. A few scattered descending and sigmoid diverticula without significant adjacent inflammatory/edematous change. Lymphatic: No abdominal or pelvic adenopathy. Reproductive: Uterus and bilateral adnexa are unremarkable. Other: No ascites.  No free air. Musculoskeletal: Degenerative disc disease in the lower thoracic and lumbar spine. Bilateral L5 pars defects without anterolisthesis. Fusion across T8-9 and T11-12 interspaces. Negative for fracture or worrisome bone lesion. IMPRESSION: VASCULAR 1. 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 Recommend followup by abdomen and pelvis CTA in 6 months, and vascular surgery referral/consultation if not already obtained. This recommendation follows ACR consensus guidelines: White Paper of the ACR Incidental Findings Committee II on Vascular Findings. J Am Coll Radiol 2013; 10:789-794. 2. Renal artery ostial stenosis, right worse than left. 3. 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 4. 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 NON-VASCULAR This 1. Cholelithiasis 2. Left nephrolithiasis without hydronephrosis. 3. Colonic diverticulosis. Electronically Signed   By: Lucrezia Europe M.D.   On: 12/14/2017 14:10    1440:  IVF  given for mild Cr elevation. No clear UTI on Udip and pt does not c/o dysuria; UC pending. Pt has tol PO well while in the ED without N/V.  No stooling while in the ED.  Pt ambulated in the ED with steady gait, easy resps, NAD. Abd benign, resps easy, VSS. Continues to deny CP. Pt states she feels better and wants to go home now.  Incidental finding on initial CT scan f/u with CTA abd/pelvis.  T/C returned from [facility] Vascular Surgeon, case discussed, including:  HPI, pertinent PM/SHx, VS/PE, dx testing, ED course and treatment:  He has viewed the images, findings likely chronic, not contributing to today's symptoms, no acute surgical issue, pt will need f/u in 6 mos in office. Dx and testing, as well as d/w Vasc Surgeon, d/w pt.  Questions answered.  Verb understanding, agreeable to d/c home with outpt f/u.   Final Clinical Impressions(s) / ED Diagnoses   Final diagnoses:  None    ED Discharge Orders    None       Francine Graven, DO 12/17/17 1752

## 2017-12-14 NOTE — Telephone Encounter (Signed)
sch appt phone NA 01/26/18 8am AAA 62am f/u MD

## 2017-12-14 NOTE — ED Notes (Signed)
Patient made aware we need an urine sample, she will notify us when she can use the restroom.

## 2017-12-14 NOTE — Discharge Instructions (Addendum)
Take the prescriptions as directed.  Increase your fluid intake (ie:  Gatoraide) for the next few days.  Eat a bland diet and advance to your regular diet slowly as you can tolerate it.  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 Recommend followup by abdomen and pelvis CTA in 6 months, and vascular surgery referral/consultation if not already obtained. This recommendation follows ACR consensus guidelines: White Paper of the ACR Incidental Findings Committee II on Vascular Findings. J Am Coll Radiol 2013; 10:789-794."  Call the Vascular Surgeon today to schedule a follow up appointment within the next 6 months for this finding.  Call your regular medical doctor tomorrow to schedule a follow up appointment in the next 3 days. Call the GI doctor today to schedule a follow up appointment within the next week.  Return to the Emergency Department immediately sooner if worsening.

## 2017-12-14 NOTE — ED Triage Notes (Signed)
Pt brought in by EMS due to pain under left breast for 4-5 days. Vomiting daily. Normal BMs. Pain is intermittent. Worse in the morning and better in pm

## 2017-12-16 LAB — URINE CULTURE: Culture: <10000 — AB

## 2017-12-18 ENCOUNTER — Other Ambulatory Visit: Payer: Self-pay

## 2017-12-18 DIAGNOSIS — I714 Abdominal aortic aneurysm, without rupture, unspecified: Secondary | ICD-10-CM

## 2017-12-28 ENCOUNTER — Other Ambulatory Visit: Payer: Self-pay | Admitting: Adult Health

## 2018-01-23 ENCOUNTER — Ambulatory Visit (INDEPENDENT_AMBULATORY_CARE_PROVIDER_SITE_OTHER): Payer: Medicare Other | Admitting: Internal Medicine

## 2018-01-23 ENCOUNTER — Encounter (INDEPENDENT_AMBULATORY_CARE_PROVIDER_SITE_OTHER): Payer: Self-pay | Admitting: Internal Medicine

## 2018-01-26 ENCOUNTER — Ambulatory Visit (INDEPENDENT_AMBULATORY_CARE_PROVIDER_SITE_OTHER): Payer: Medicare Other | Admitting: Vascular Surgery

## 2018-01-26 ENCOUNTER — Ambulatory Visit (HOSPITAL_COMMUNITY)
Admission: RE | Admit: 2018-01-26 | Discharge: 2018-01-26 | Disposition: A | Discharge status: Home / Self Care | Payer: Medicare Other | Source: Ambulatory Visit | Attending: Vascular Surgery | Admitting: Vascular Surgery

## 2018-01-26 ENCOUNTER — Encounter: Payer: Self-pay | Admitting: Vascular Surgery

## 2018-01-26 ENCOUNTER — Other Ambulatory Visit: Payer: Self-pay

## 2018-01-26 VITALS — BP 138/86 | HR 89 | Temp 97.3°F | Resp 16 | Ht 67.0 in | Wt 194.0 lb

## 2018-01-26 DIAGNOSIS — I714 Abdominal aortic aneurysm, without rupture, unspecified: Secondary | ICD-10-CM

## 2018-01-26 DIAGNOSIS — F17200 Nicotine dependence, unspecified, uncomplicated: Secondary | ICD-10-CM | POA: Diagnosis not present

## 2018-01-26 DIAGNOSIS — I10 Essential (primary) hypertension: Secondary | ICD-10-CM | POA: Diagnosis not present

## 2018-01-26 DIAGNOSIS — I723 Aneurysm of iliac artery: Secondary | ICD-10-CM | POA: Diagnosis not present

## 2018-01-26 DIAGNOSIS — E119 Type 2 diabetes mellitus without complications: Secondary | ICD-10-CM | POA: Diagnosis not present

## 2018-01-26 NOTE — Progress Notes (Signed)
Patient ID: Tamara Carpenter, female   DOB: [birth year], 68 y.o.   MRN: [id number]  Reason for Consult: AAA (6 month f/u)   Referred by [facility]  Subjective:     HPI:  Tamara Carpenter is a 67 y.o. female presents for evaluation of infrarenal aortic aneurysm.  Her mother had a brain aneurysm but there is no other history of aneurysms in her family.  She is a chronic lifelong smoker.  She does have previous amputations of 2-1/2 toes on the right and her great toe on the left.  She also had a 20 artery bypass grafting in 2001 and follow-up stenting as recently as August 2018.  She currently takes aspirin and Plavix.  CT scan that demonstrated her aneurysm was performed for colitis.  That is now resolved she is back to having normal stools and her diet has resumed.  She is able to walk but does have pain in both legs and has a history of bilateral SFA stenting.  She has no new back or abdominal pain.  Past Medical History:  Diagnosis Date  . Back pain   . 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 CAD (coronary artery disease)    a. CABG in 2001 b. cath 12/29/16 - Successful PTCA/DES x 2 proximal body of SVG to OM & PTCA/DES x 1 distal body of SVG to RCA.   . Diabetes mellitus without complication ([place])   . Hypertension   . PVD (peripheral vascular disease) ([place])    has stents in both legs   Family History  Problem Relation Age of Onset  . CAD Mother 43  . Diabetes Sister   . Hypertension Sister   . CAD Brother    Past Surgical History:  Procedure Laterality Date  . cardiac bypass  2001   3v  . CORONARY STENT INTERVENTION N/A 12/29/2016   Procedure: CORONARY STENT INTERVENTION;  Surgeon: Burnell Blanks, MD;  Location: [facility];  Service: Cardiovascular;  Laterality: N/A;  . multiple toe amputations  2010-2017   3 toes on the right foot, 1 on the left  . nose removal  2004   basal cell carcinoma  . RIGHT/LEFT HEART CATH AND  CORONARY/GRAFT ANGIOGRAPHY N/A 12/29/2016   Procedure: RIGHT/LEFT HEART CATH AND CORONARY/GRAFT ANGIOGRAPHY;  Surgeon: Burnell Blanks, MD;  Location: [facility];  Service: Cardiovascular;  Laterality: N/A;    Short Social History:  Social History   Tobacco Use  . Smoking status: Heavy Tobacco Smoker    Packs/day: 0.50    Years: 48.00    Pack years: 24.00    Types: Cigarettes    Start date: 09/28/1968  . Smokeless tobacco: Never Used  Substance Use Topics  . Alcohol use: No    Allergies  Allergen Reactions  . Nitroglycerin     NOTE: Patient states that she can take the tablets but is not able to use the patches    Current Outpatient Medications  Medication Sig Dispense Refill  . aspirin EC 81 MG tablet Take 81 mg by mouth daily.    . carvedilol (COREG) 6.25 MG tablet TAKE 1 & 1/2 TABLETS BY MOUTH TWICE DAILY 270 tablet 0  . furosemide (LASIX) 20 MG tablet Take 1 tablet (20 mg total) by mouth daily as needed. 30 tablet 3  . insulin degludec (TRESIBA FLEXTOUCH) 100 UNIT/ML SOPN FlexTouch Pen Inject 20 Units into the skin daily at  10 pm.     . lisinopril-hydrochlorothiazide (PRINZIDE,ZESTORETIC) 20-12.5 MG tablet Take 1 tablet by mouth daily.  3  . Multiple Vitamin (MULTIVITAMIN) tablet Take 1 tablet by mouth daily.    . nitroGLYCERIN (NITROSTAT) 0.4 MG SL tablet Place 1 tablet (0.4 mg total) under the tongue every 5 (five) minutes as needed for chest pain. 25 tablet 3  . ticagrelor (BRILINTA) 90 MG TABS tablet Take 1 tablet (90 mg total) by mouth 2 (two) times daily. 60 tablet 6  . traMADol (ULTRAM) 50 MG tablet Take 50 mg by mouth every 6 (six) hours as needed for moderate pain or severe pain.    . Vitamin D, Ergocalciferol, (DRISDOL) 50000 units CAPS capsule Take 1 capsule by mouth once a week.  1  . famotidine (PEPCID) 20 MG tablet Take 1 tablet (20 mg total) by mouth 2 (two) times daily. (Patient not taking: Reported on 01/26/2018) 30 tablet 0  . ondansetron (ZOFRAN  ODT) 4 MG disintegrating tablet Take 1 tablet (4 mg total) by mouth every 8 (eight) hours as needed for nausea or vomiting. (Patient not taking: Reported on 01/26/2018) 6 tablet 0  . pravastatin (PRAVACHOL) 80 MG tablet Take 1 tablet (80 mg total) by mouth every evening. (Patient not taking: Reported on 01/26/2018) 30 tablet 6   No current facility-administered medications for this visit.     Review of Systems  Constitutional:  Constitutional negative. HENT: HENT negative.  Respiratory: Positive for shortness of breath.  Cardiovascular: Positive for claudication.  GI: Gastrointestinal negative.  Skin: Skin negative.  Neurological: Neurological negative. Hematologic: Hematologic/lymphatic negative.  Psychiatric: Psychiatric negative.        Objective:  Objective   Vitals:   01/26/18 0837 01/26/18 0841  BP: (!) 149/91 138/86  Pulse: 89 89  Resp: 16   Temp: (!) 97.3 F (36.3 C)   TempSrc: Oral   SpO2: 98%   Weight: 194 lb (88 kg)   Height: 5\' 7"  (1.702 m)    Body mass index is 30.38 kg/m.  Physical Exam  Constitutional: She appears well-developed.  HENT:  Head: Normocephalic.  Eyes: Pupils are equal, round, and reactive to light.  Neck: Normal range of motion.  Cardiovascular: Normal rate.  Pulses:      Popliteal pulses are 0 on the right side, and 0 on the left side.  Pulmonary/Chest: Effort normal.  Abdominal: Soft.  Musculoskeletal: Normal range of motion. She exhibits no edema.  Neurological: She is alert.  Skin: Skin is warm and dry. Capillary refill takes less than 2 seconds.  Psychiatric: She has a normal mood and affect. Her behavior is normal. Judgment and thought content normal.    Data: I reviewed the patient's CT scan with her today which demonstrates a 4.5 cm fusiform aneurysm in the infrarenal component with thrombus extending up into what would be the neck of the aneurysm.  I independently interpreted her abdominal aortic duplex which demonstrated  greatest transverse size abdominal aorta being 3.9 cm.     Assessment/Plan:     67 year old female with strong history of coronary artery disease as well as bilateral SFA stenting.  I can actually not palpate popliteal pulses today suggesting the potentially her stents are occluded but she does have healed toe amputations bilaterally.  From an aneurysm standpoint we will follow her up in 6 months with repeat duplex and if there is any change she will need a repeat CT angios to evaluate.  She would likely require complex endovascular grafting with  either parallel grafting or fenestrated repair given what is an apparent unhealthy neck below the renal arteries.  I reviewed this with her directly she demonstrates good understanding we will get her to follow-up in 6 months.     Waynetta Sandy MD [facility]

## 2018-02-19 ENCOUNTER — Ambulatory Visit (INDEPENDENT_AMBULATORY_CARE_PROVIDER_SITE_OTHER): Payer: Medicare Other | Admitting: Cardiology

## 2018-02-19 ENCOUNTER — Encounter: Payer: Self-pay | Admitting: Cardiology

## 2018-02-19 VITALS — BP 154/84 | HR 84 | Ht 67.0 in | Wt 202.2 lb

## 2018-02-19 DIAGNOSIS — I10 Essential (primary) hypertension: Secondary | ICD-10-CM | POA: Diagnosis not present

## 2018-02-19 DIAGNOSIS — I5022 Chronic systolic (congestive) heart failure: Secondary | ICD-10-CM

## 2018-02-19 DIAGNOSIS — I2510 Atherosclerotic heart disease of native coronary artery without angina pectoris: Secondary | ICD-10-CM

## 2018-02-19 MED ORDER — CARVEDILOL 12.5 MG PO TABS: 12.5000 mg | ORAL_TABLET | Freq: Two times a day (BID) | ORAL | 1 refills | Status: DC

## 2018-02-19 NOTE — Progress Notes (Signed)
Clinical Summary Ms. Overley is a 66 y.o.female seen today for follow up of the following medical problems   1. CAD/Chronic systolic heart failure - previously followed by Dr Dossie Der in [place], [place] - history of prior CABG in 2001 - admitted 12/2016 with signs of CHF, elevated trop to 0.54. - 12/2016 echo LVEF 25-30%, abnormal diastolic function, mild RV dysfunction - 12/2016 RHC/LHC: severe 3 vessel disease. 99% stenosis in SVG to OM, received DES x 2 and DES to SVG to RCA.   10/2017 echo LVEF 24%, grade I diastolic dysfunction - medical therapy has been limited due to lable renal function, had not previously been on ACEI but currently on lisinopril - takes her prn lasix, about 2-3 times per week.     2. HTN - she is compliant with meds  3. PAD - prior interventions - no recent symptoms  4. AAA - incidental finding during 11/2017 ER visit.  - 11/2017 CT with 4.6 cm AAA, followed by vascular.   5. Renal artery stenosis - noted by 11/2017 CTA A/P  Past Medical History:  Diagnosis Date  . Back pain   . 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 CAD (coronary artery disease)    a. CABG in 2001 b. cath 12/29/16 - Successful PTCA/DES x 2 proximal body of SVG to OM & PTCA/DES x 1 distal body of SVG to RCA.   . Diabetes mellitus without complication ([place])   . Hypertension   . PVD (peripheral vascular disease) ([place])    has stents in both legs     Allergies  Allergen Reactions  . Nitroglycerin     NOTE: Patient states that she can take the tablets but is not able to use the patches     Current Outpatient Medications  Medication Sig Dispense Refill  . aspirin EC 81 MG tablet Take 81 mg by mouth daily.    . carvedilol (COREG) 6.25 MG tablet TAKE 1 & 1/2 TABLETS BY MOUTH TWICE DAILY 270 tablet 0  . famotidine (PEPCID) 20 MG tablet Take 1 tablet (20 mg total) by mouth 2 (two) times daily. (Patient not taking: Reported on 01/26/2018) 30 tablet 0  .  furosemide (LASIX) 20 MG tablet Take 1 tablet (20 mg total) by mouth daily as needed. 30 tablet 3  . insulin degludec (TRESIBA FLEXTOUCH) 100 UNIT/ML SOPN FlexTouch Pen Inject 20 Units into the skin daily at 10 pm.     . lisinopril-hydrochlorothiazide (PRINZIDE,ZESTORETIC) 20-12.5 MG tablet Take 1 tablet by mouth daily.  3  . Multiple Vitamin (MULTIVITAMIN) tablet Take 1 tablet by mouth daily.    . nitroGLYCERIN (NITROSTAT) 0.4 MG SL tablet Place 1 tablet (0.4 mg total) under the tongue every 5 (five) minutes as needed for chest pain. 25 tablet 3  . ondansetron (ZOFRAN ODT) 4 MG disintegrating tablet Take 1 tablet (4 mg total) by mouth every 8 (eight) hours as needed for nausea or vomiting. (Patient not taking: Reported on 01/26/2018) 6 tablet 0  . pravastatin (PRAVACHOL) 80 MG tablet Take 1 tablet (80 mg total) by mouth every evening. (Patient not taking: Reported on 01/26/2018) 30 tablet 6  . ticagrelor (BRILINTA) 90 MG TABS tablet Take 1 tablet (90 mg total) by mouth 2 (two) times daily. 60 tablet 6  . traMADol (ULTRAM) 50 MG tablet Take 50 mg by mouth every 6 (six) hours as needed for moderate pain or severe pain.    Marland Kitchen  Vitamin D, Ergocalciferol, (DRISDOL) 50000 units CAPS capsule Take 1 capsule by mouth once a week.  1   No current facility-administered medications for this visit.      Past Surgical History:  Procedure Laterality Date  . cardiac bypass  2001   3v  . CORONARY STENT INTERVENTION N/A 12/29/2016   Procedure: CORONARY STENT INTERVENTION;  Surgeon: Burnell Blanks, MD;  Location: [facility];  Service: Cardiovascular;  Laterality: N/A;  . multiple toe amputations  2010-2017   3 toes on the right foot, 1 on the left  . nose removal  2004   basal cell carcinoma  . RIGHT/LEFT HEART CATH AND CORONARY/GRAFT ANGIOGRAPHY N/A 12/29/2016   Procedure: RIGHT/LEFT HEART CATH AND CORONARY/GRAFT ANGIOGRAPHY;  Surgeon: Burnell Blanks, MD;  Location: [facility];   Service: Cardiovascular;  Laterality: N/A;     Allergies  Allergen Reactions  . Nitroglycerin     NOTE: Patient states that she can take the tablets but is not able to use the patches      Family History  Problem Relation Age of Onset  . CAD Mother 79  . Diabetes Sister   . Hypertension Sister   . CAD Brother      Social History Ms. Kary reports that she has been smoking cigarettes. She started smoking about 49 years ago. She has a 24.00 pack-year smoking history. She has never used smokeless tobacco. Ms. Gossett reports that she does not drink alcohol.   Review of Systems CONSTITUTIONAL: No weight loss, fever, chills, weakness or fatigue.  HEENT: Eyes: No visual loss, blurred vision, double vision or yellow sclerae.No hearing loss, sneezing, congestion, runny nose or sore throat.  SKIN: No rash or itching.  CARDIOVASCULAR: per hpi RESPIRATORY: No shortness of breath, cough or sputum.  GASTROINTESTINAL: No anorexia, nausea, vomiting or diarrhea. No abdominal pain or blood.  GENITOURINARY: No burning on urination, no polyuria NEUROLOGICAL: No headache, dizziness, syncope, paralysis, ataxia, numbness or tingling in the extremities. No change in bowel or bladder control.  MUSCULOSKELETAL: No muscle, back pain, joint pain or stiffness.  LYMPHATICS: No enlarged nodes. No history of splenectomy.  PSYCHIATRIC: No history of depression or anxiety.  ENDOCRINOLOGIC: No reports of sweating, cold or heat intolerance. No polyuria or polydipsia.  Marland Kitchen   Physical Examination Vitals:   02/19/18 1401  BP: (!) 154/84  Pulse: 84  SpO2: 99%   Vitals:   02/19/18 1401  Weight: 202 lb 3.2 oz (91.7 kg)  Height: 5\' 7"  (1.702 m)    Gen: resting comfortably, no acute distress HEENT: no scleral icterus, pupils equal round and reactive, no palptable cervical adenopathy,  CV: RRR, no m/r/g, no jvd Resp: Clear to auscultation bilaterally GI: abdomen is soft, non-tender, non-distended, normal  bowel sounds, no hepatosplenomegaly MSK: extremities are warm, no edema.  Skin: warm, no rash Neuro:  no focal deficits Psych: appropriate affect   Diagnostic Studies  12/2016 RHC/LHC  SVG graft was visualized by angiography.  Ost RCA to Prox RCA lesion, 100 %stenosed.  Prox Cx to Mid Cx lesion, 100 %stenosed.  SVG graft was visualized by angiography.  A STENT RESOLUTE ONYX 3.5X18 drug eluting stent was successfully placed.  Dist Graft lesion, 99 %stenosed.  Post intervention, there is a 0% residual stenosis.  Prox Graft lesion, 99 %stenosed.  Post intervention, there is a 0% residual stenosis.  A STENT RESOLUTE ONYX 3.5X12 drug eluting stent was successfully placed.  Prox LAD to Mid LAD lesion, 99 %  stenosed.  LIMA graft was visualized by angiography.  LV end diastolic pressure is normal.  Mid LAD to Dist LAD lesion, 30 %stenosed.  1. Severe triple vessel CAD s/p 3V CABG with 3/3 patent bypass grafts 2. The mid LAD has high grade stenosis. The mid and distal LAD fills from the patent LIMA graft 3. The Circumflex is occluded in the mid segment. The OM fills from the patent vein graft. The proximal body of the vein graft has a 99% stenosis.  4. Successful PTCA/DES x 2 proximal body of SVG to OM 5. The RCA is proximally occluded. The vein graft to the distal RCA has a 99% stenosis.  6. Successful PTCA/DES x 1 distal body of SVG to RCA  Recommendations: DAPT with ASA and Brilinta for one year. Continue statin and beta blocker  12/2016 echo Study Conclusions  - Left ventricle: The cavity size was normal. Wall thickness was increased in a pattern of moderate LVH. Systolic function was severely reduced. The estimated ejection fraction was in the range of 25% to 30%. Doppler parameters are consistent with restrictive physiology, indicative of decreased left ventricular diastolic compliance and/or increased left atrial pressure. Doppler parameters are  consistent with high ventricular filling pressure. - Aortic valve: Valve area (VTI): 2.99 cm^2. Valve area (Vmax): 2.53 cm^2. Valve area (Vmean): 2.95 cm^2. - Mitral valve: There was mild regurgitation. - Left atrium: The atrium was severely dilated. - Right ventricle: Systolic function was mildly reduced. - Right atrium: The atrium was mildly dilated. - Inferior vena cava: The vessel was dilated. The respirophasic diameter changes were blunted (<50%), consistent with elevated central venous pressure. - Technically difficult study. Consider limited contrast study to more precisely describe her LV function and wall motion.  10/2017 echo Study Conclusions  - Left ventricle: Images are limited - suggest Definity contrast   study for further evaluation. The cavity size was normal. There   was severe focal basal hypertrophy of the septum. The estimated   ejection fraction was approximately 35%. There is hypokinesis of   the mid-apicalanteroseptal and apical myocardium. Doppler   parameters are consistent with abnormal left ventricular   relaxation (grade 1 diastolic dysfunction). - Mitral valve: Mildly calcified annulus. There was mild   regurgitation. - Right atrium: Central venous pressure (est): 3 mm Hg. - Atrial septum: No defect or patent foramen ovale was identified. - Tricuspid valve: There was trivial regurgitation. - Pulmonary arteries: Systolic pressure could not be accurately   estimated. - Pericardium, extracardiac: A prominent pericardial fat pad was   present.  Assessment and Plan   1. CAD - no symptoms. She has completed 1 year of DAPT, we will stop her brillinta   2. Chronic systolic HF - increase coreg to 12.5mg  bid. Check BMET, pending renal function would change lisinopril to entresto - titrate CHF meds as tolerated and then repeat echo, pending results at that time may require ICD consideration  3. HTN - elevaetd, increasing coreg   Nursing  visit in 2weeks, if vitals stable increase coreg. F/u labs, if renal function acceptable change to entresto. F/u with me 2 months     Arnoldo Lenis, M.D.

## 2018-02-19 NOTE — Patient Instructions (Signed)
Your physician recommends that you schedule a follow-up appointment in: [place]  Your physician has recommended you make the following change in your medication:   INCREASE COREG 12.5 MG [place]   Your physician recommends that you return for lab work in: [facility]  Thank you for choosing [facility]!!

## 2018-02-21 ENCOUNTER — Telehealth: Payer: Self-pay | Admitting: *Deleted

## 2018-02-21 ENCOUNTER — Other Ambulatory Visit: Payer: Self-pay | Admitting: *Deleted

## 2018-02-21 DIAGNOSIS — I5022 Chronic systolic (congestive) heart failure: Secondary | ICD-10-CM

## 2018-02-21 DIAGNOSIS — I10 Essential (primary) hypertension: Secondary | ICD-10-CM

## 2018-02-21 MED ORDER — HYDRALAZINE HCL 25 MG PO TABS: 25.0000 mg | ORAL_TABLET | Freq: Three times a day (TID) | ORAL | 3 refills | Status: DC

## 2018-02-21 MED ORDER — ISOSORBIDE MONONITRATE ER 30 MG PO TB24: 30.0000 mg | ORAL_TABLET | Freq: Every day | ORAL | 3 refills | Status: DC

## 2018-02-21 NOTE — Telephone Encounter (Signed)
Patient informed and verbalized understanding of plan. Patient will pick up lab work orders at her nurse visit on 03/05/18.

## 2018-02-21 NOTE — Telephone Encounter (Signed)
-----   Message from Arnoldo Lenis, MD sent at 02/21/2018  2:21 PM EDT ----- Kidney function has worsened, it may be due to her bp pill the lisinopril-HCTZ combination. Can she stop this pill, start imdur 30mg  daily and hydralazine 25mg  tid.REpeat BMET in 2 weeks.   Zandra Abts MD

## 2018-02-26 ENCOUNTER — Other Ambulatory Visit: Payer: Self-pay | Admitting: Adult Health

## 2018-03-05 ENCOUNTER — Encounter: Payer: Self-pay | Admitting: *Deleted

## 2018-03-05 ENCOUNTER — Ambulatory Visit (INDEPENDENT_AMBULATORY_CARE_PROVIDER_SITE_OTHER): Payer: Medicare Other | Admitting: *Deleted

## 2018-03-05 VITALS — BP 132/69 | HR 76 | Ht 67.0 in | Wt 203.8 lb

## 2018-03-05 DIAGNOSIS — I5022 Chronic systolic (congestive) heart failure: Secondary | ICD-10-CM | POA: Diagnosis not present

## 2018-03-05 DIAGNOSIS — I10 Essential (primary) hypertension: Secondary | ICD-10-CM | POA: Diagnosis not present

## 2018-03-05 NOTE — Patient Instructions (Signed)
   Continue same plan as before.   We will contact you with Dr. Nelly Laurence response to your visit today.   Have lab work done to check your BMET around 03/08/18.

## 2018-03-05 NOTE — Progress Notes (Signed)
Patient presents to office for nurse visit to check vital signs per last office visit. Patient has taken all doses of medications without side effects. No c/o dizziness, chest pain or sob.

## 2018-03-06 ENCOUNTER — Telehealth: Payer: Self-pay | Admitting: *Deleted

## 2018-03-06 MED ORDER — CARVEDILOL 25 MG PO TABS: 25.0000 mg | ORAL_TABLET | Freq: Two times a day (BID) | ORAL | 3 refills | Status: DC

## 2018-03-06 NOTE — Telephone Encounter (Signed)
Per [place]: Vitals look fine, please increase coreg to 25mg  bid. Has she gone for her blood work since our last visit?   Zandra Abts MD  Patient informed and verbalized understanding of plan. Patient is having lab work done on 03/08/18 at [facility].

## 2018-03-06 NOTE — Telephone Encounter (Signed)
-----   Message from Arnoldo Lenis, MD sent at 03/06/2018 12:51 PM EDT -----   ----- Message ----- From: Merlene Laughter, RN Sent: 03/05/2018   1:14 PM EDT To: Arnoldo Lenis, MD

## 2018-03-06 NOTE — Progress Notes (Signed)
Vitals look fine, please increase coreg to 25mg  bid. Has she gone for her blood work since our last visit?   Zandra Abts MD

## 2018-03-09 ENCOUNTER — Other Ambulatory Visit (HOSPITAL_COMMUNITY)
Admission: RE | Admit: 2018-03-09 | Discharge: 2018-03-09 | Disposition: A | Discharge status: Home / Self Care | Payer: Medicare Other | Source: Ambulatory Visit | Attending: Cardiology | Admitting: Cardiology

## 2018-03-09 DIAGNOSIS — I5022 Chronic systolic (congestive) heart failure: Secondary | ICD-10-CM | POA: Insufficient documentation

## 2018-03-09 DIAGNOSIS — I10 Essential (primary) hypertension: Secondary | ICD-10-CM | POA: Diagnosis present

## 2018-03-09 LAB — BASIC METABOLIC PANEL
Anion gap: 8 (ref 5–15)
BUN: 19 mg/dL (ref 8–23)
CALCIUM: 8.2 mg/dL — AB (ref 8.9–10.3)
CO2: 18 mmol/L — ABNORMAL LOW (ref 22–32)
CREATININE: 1.24 mg/dL — AB (ref 0.44–1.00)
Chloride: 111 mmol/L (ref 98–111)
GFR calc non Af Amer: 45 mL/min — ABNORMAL LOW (ref >60)
GFR, EST AFRICAN AMERICAN: 52 mL/min — AB (ref >60)
Glucose, Bld: 178 mg/dL — ABNORMAL HIGH (ref 70–99)
Potassium: 4.9 mmol/L (ref 3.5–5.1)
Sodium: 137 mmol/L (ref 135–145)

## 2018-03-16 ENCOUNTER — Telehealth: Payer: Self-pay | Admitting: *Deleted

## 2018-03-16 NOTE — Telephone Encounter (Signed)
-----   Message from Arnoldo Lenis, MD sent at 03/13/2018 11:27 AM EDT ----- Kidney funciton is improving, no med changes at this time   Zandra Abts MD

## 2018-03-16 NOTE — Telephone Encounter (Signed)
Pt aware - routed to pcp  

## 2018-04-19 ENCOUNTER — Ambulatory Visit (INDEPENDENT_AMBULATORY_CARE_PROVIDER_SITE_OTHER): Payer: Medicare Other

## 2018-04-19 ENCOUNTER — Encounter (INDEPENDENT_AMBULATORY_CARE_PROVIDER_SITE_OTHER): Payer: Self-pay | Admitting: Orthopaedic Surgery

## 2018-04-19 ENCOUNTER — Ambulatory Visit (INDEPENDENT_AMBULATORY_CARE_PROVIDER_SITE_OTHER): Payer: Medicare Other | Admitting: Orthopaedic Surgery

## 2018-04-19 VITALS — BP 109/52 | HR 76 | Ht 67.0 in | Wt 213.0 lb

## 2018-04-19 DIAGNOSIS — I2510 Atherosclerotic heart disease of native coronary artery without angina pectoris: Secondary | ICD-10-CM

## 2018-04-19 DIAGNOSIS — M25511 Pain in right shoulder: Secondary | ICD-10-CM | POA: Diagnosis not present

## 2018-04-19 DIAGNOSIS — M545 Low back pain, unspecified: Secondary | ICD-10-CM | POA: Insufficient documentation

## 2018-04-19 DIAGNOSIS — I739 Peripheral vascular disease, unspecified: Secondary | ICD-10-CM | POA: Diagnosis not present

## 2018-04-19 DIAGNOSIS — F17200 Nicotine dependence, unspecified, uncomplicated: Secondary | ICD-10-CM

## 2018-04-19 DIAGNOSIS — G8929 Other chronic pain: Secondary | ICD-10-CM

## 2018-04-19 NOTE — Progress Notes (Signed)
Office Visit Note   Patient: Tamara Carpenter           Date of Birth: 26-Feb-1951           MRN: [id number] Visit Date: 04/19/2018              Requested by: [facility] [address], [place] [address] PCP: [facility]   Assessment & Plan: Visit Diagnoses:  1. Chronic bilateral low back pain, unspecified whether sciatica present   2. Chronic right shoulder pain   3. Tobacco dependence   4. PVD (peripheral vascular disease) ([place])     Plan: We discussed Tylenol pain.  We will schedule her for some arterial Dopplers and some of her lower extremity symptoms of weakness may be related to arterial compression.  Can call her about her Doppler results.  Follow-Up Instructions: No follow-ups on file.   Orders:  Orders Placed This Encounter  Procedures  . XR Lumbar Spine 2-3 Views  . XR Shoulder Right   No orders of the defined types were placed in this encounter.     Procedures: No procedures performed   Clinical Data: No additional findings.   Subjective: Chief Complaint  Patient presents with  . Right Shoulder - Pain  . Lower Back - Pain    HPI 67 year old female with 2 problems one is right shoulder pain after a history of fracture 12/29/1997 which healed.  She has pain with outstretched reaching overhead activities.  Some days are better than others.  Her other problem is chronic low back pain she is unable to stand more than 5 minutes and states both legs feel weak numb and sometimes have pins-and-needles.  Previous problems with occlusion and had stents placed in both legs 2013 at [facility] and also had triple bypass.  She has an abdominal aneurysm seen on CT scan.  Previous partial nose resection due to basal cell cancer.  No associated neck pain with her shoulder symptoms.  Review of Systems positive for obesity with insulin dependent diabetes type 2.  History of colitis.  She had cardiac stents, triple bypass.  Stents in  2018 and triple bypass 2001.  Left great toe second toe amputation 2010 right great toe amputation 2013 due to PAD.  Amputation little toe chin 2017.  Positive for bladder problems heart disease hypertension migraines history of pneumonia.  She takes tramadol every 6 hours for pain.   Objective: Vital Signs: BP (!) 109/52   Pulse 76   Ht 5\' 7"  (1.702 m)   Wt 213 lb (96.6 kg)   BMI 33.36 kg/m   Physical Exam  Constitutional: She is oriented to person, place, and time. She appears well-developed.  HENT:  Head: Normocephalic.  Right Ear: External ear normal.  Left Ear: External ear normal.  Eyes: Pupils are equal, round, and reactive to light.  Neck: No tracheal deviation present. No thyromegaly present.  Cardiovascular: Normal rate.  Pulmonary/Chest: Effort normal.  Abdominal: Soft.  Neurological: She is alert and oriented to person, place, and time.  Skin: Skin is warm and dry.  Psychiatric: She has a normal mood and affect. Her behavior is normal.    Ortho Exam positive for impingement right shoulder.  No angular deformity or tenderness over the humerus.  Supraspinatus takes good resistance.  She lacks 20 degrees with abduction.  Long head of the biceps is normal upper extremity reflexes are 2+ good cervical range of motion.  She has  some sciatic notch tenderness some pain with straight leg raising at 90 degrees right and left.  Toe amputations as listed above. Specialty Comments:  No specialty comments available.  Imaging: No results found.   PMFS History: Patient Active Problem List   Diagnosis Date Noted  . Chronic bilateral low back pain 04/19/2018  . Chronic right shoulder pain 04/19/2018  . Hypotension 01/17/2017  . CAD S/P percutaneous coronary angioplasty 01/17/2017  . Cardiomyopathy, ischemic 01/17/2017  . Status post coronary artery stent placement   . Chronic systolic heart failure ([place])   . Acute heart failure ([place])   . Elevated troponin   . CHF exacerbation  ([place]) 12/27/2016  . NSTEMI (non-ST elevated myocardial infarction) ([place]) 12/27/2016  . Diabetes mellitus type 2 in obese ([place]) 12/27/2016  . Tobacco dependence 12/27/2016  . Essential hypertension 12/27/2016  . S/P CABG x 3 12/27/2016  . PVD (peripheral vascular disease) ([place]) 12/27/2016   Past Medical History:  Diagnosis Date  . Back pain   . 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 CAD (coronary artery disease)    a. CABG in 2001 b. cath 12/29/16 - Successful PTCA/DES x 2 proximal body of SVG to OM & PTCA/DES x 1 distal body of SVG to RCA.   . Diabetes mellitus without complication ([place])   . Hypertension   . PVD (peripheral vascular disease) ([place])    has stents in both legs    Family History  Problem Relation Age of Onset  . CAD Mother 60  . Diabetes Sister   . Hypertension Sister   . CAD Brother     Past Surgical History:  Procedure Laterality Date  . cardiac bypass  2001   3v  . CORONARY STENT INTERVENTION N/A 12/29/2016   Procedure: CORONARY STENT INTERVENTION;  Surgeon: Burnell Blanks, MD;  Location: [facility];  Service: Cardiovascular;  Laterality: N/A;  . multiple toe amputations  2010-2017   3 toes on the right foot, 1 on the left  . nose removal  2004   basal cell carcinoma  . RIGHT/LEFT HEART CATH AND CORONARY/GRAFT ANGIOGRAPHY N/A 12/29/2016   Procedure: RIGHT/LEFT HEART CATH AND CORONARY/GRAFT ANGIOGRAPHY;  Surgeon: Burnell Blanks, MD;  Location: [facility];  Service: Cardiovascular;  Laterality: N/A;   Social History   Occupational History  . Occupation: disabled  Tobacco Use  . Smoking status: Heavy Tobacco Smoker    Packs/day: 0.50    Years: 48.00    Pack years: 24.00    Types: Cigarettes    Start date: 09/28/1968  . Smokeless tobacco: Never Used  Substance and Sexual Activity  . Alcohol use: No  . Drug use: No  . Sexual activity: Not on file

## 2018-05-02 ENCOUNTER — Encounter (HOSPITAL_COMMUNITY): Payer: Medicare Other

## 2018-05-08 ENCOUNTER — Ambulatory Visit: Payer: Medicare Other | Admitting: Cardiology

## 2018-05-15 ENCOUNTER — Telehealth (INDEPENDENT_AMBULATORY_CARE_PROVIDER_SITE_OTHER): Payer: Self-pay | Admitting: Orthopaedic Surgery

## 2018-05-15 ENCOUNTER — Encounter (HOSPITAL_COMMUNITY): Payer: Medicare Other

## 2018-05-15 NOTE — Telephone Encounter (Signed)
fyi

## 2018-05-15 NOTE — Telephone Encounter (Signed)
[facility]  [phone]    Juliann Pulse called wanted to inform Dr.Yates about the status of patient appointments.    05/02/18 patient no showed   05/15/18 patient canceled

## 2018-06-01 ENCOUNTER — Other Ambulatory Visit: Payer: Self-pay | Admitting: *Deleted

## 2018-06-01 DIAGNOSIS — I714 Abdominal aortic aneurysm, without rupture, unspecified: Secondary | ICD-10-CM

## 2018-06-01 DIAGNOSIS — I739 Peripheral vascular disease, unspecified: Secondary | ICD-10-CM

## 2018-06-11 ENCOUNTER — Encounter: Payer: Self-pay | Admitting: Cardiology

## 2018-06-11 ENCOUNTER — Telehealth: Payer: Self-pay | Admitting: Cardiology

## 2018-06-11 ENCOUNTER — Ambulatory Visit (INDEPENDENT_AMBULATORY_CARE_PROVIDER_SITE_OTHER): Payer: Medicare Other | Admitting: Cardiology

## 2018-06-11 VITALS — BP 143/81 | HR 79 | Ht 67.0 in | Wt 212.2 lb

## 2018-06-11 DIAGNOSIS — I2510 Atherosclerotic heart disease of native coronary artery without angina pectoris: Secondary | ICD-10-CM | POA: Diagnosis not present

## 2018-06-11 DIAGNOSIS — I10 Essential (primary) hypertension: Secondary | ICD-10-CM

## 2018-06-11 DIAGNOSIS — I5022 Chronic systolic (congestive) heart failure: Secondary | ICD-10-CM

## 2018-06-11 NOTE — Patient Instructions (Addendum)
Medication Instructions:   Your physician recommends that you continue on your current medications as directed. Please refer to the Current Medication list given to you today.  Labwork:  NONE  Testing/Procedures: Your physician has requested that you have an echocardiogram. Echocardiography is a painless test that uses sound waves to create images of your heart. It provides your doctor with information about the size and shape of your heart and how well your heart's chambers and valves are working. This procedure takes approximately one hour. There are no restrictions for this procedure.  Follow-Up:  Your physician recommends that you schedule a follow-up appointment in: pending test result.  Any Other Special Instructions Will Be Listed Below (If Applicable).  If you need a refill on your cardiac medications before your next appointment, please call your pharmacy. 

## 2018-06-11 NOTE — Progress Notes (Signed)
Clinical Summary Tamara Carpenter is a 68 y.o.female seen today for follow up of the following medical problems   1. CAD/Chronic systolic heart failure - previously followed by Dr Dossie Der in [place], [place] - history of prior CABG in 2001 - admitted 12/2016 with signs of CHF, elevated trop to 0.54. - 12/2016 echo LVEF 25-30%, abnormal diastolic function, mild RV dysfunction - 12/2016 RHC/LHC: severe 3 vessel disease. 99% stenosis in SVG to OM, received DES x 2 and DES to SVG to RCA.  10/2017 echo LVEF 16%, grade I diastolic dysfunction   - AKI 01/2018 with Cr up to 1.58. ACE-I stopped at that time. Started on hydral.imdur instead. Note had some renal artery stenosis by prior CT, unclear if related.    - no recent chest pain, no SOB/DOE - no recent edema - upcoming labs with pcp mid February.   2. HTN -she is compliant with meds  3. PAD - prior interventions - followed by vascular  4. AAA - incidental finding during 11/2017 ER visit.  - 11/2017 CT with 4.6 cm AAA, followed by vascular.   5. Renal artery stenosis - noted by 11/2017 CTA A/P   Past Medical History:  Diagnosis Date  . Back pain   . 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 CAD (coronary artery disease)    a. CABG in 2001 b. cath 12/29/16 - Successful PTCA/DES x 2 proximal body of SVG to OM & PTCA/DES x 1 distal body of SVG to RCA.   . Diabetes mellitus without complication ([place])   . Hypertension   . PVD (peripheral vascular disease) ([place])    has stents in both legs     Allergies  Allergen Reactions  . Nitroglycerin     NOTE: Patient states that she can take the tablets but is not able to use the patches     Current Outpatient Medications  Medication Sig Dispense Refill  . aspirin EC 81 MG tablet Take 81 mg by mouth daily.    . carvedilol (COREG) 25 MG tablet Take 1 tablet (25 mg total) by mouth 2 (two) times daily. 180 tablet 3  . furosemide (LASIX) 20 MG tablet Take 1  tablet (20 mg total) by mouth daily as needed. 30 tablet 3  . hydrALAZINE (APRESOLINE) 25 MG tablet Take 1 tablet (25 mg total) by mouth 3 (three) times daily. 270 tablet 3  . insulin degludec (TRESIBA FLEXTOUCH) 100 UNIT/ML SOPN FlexTouch Pen Inject 20 Units into the skin daily at 10 pm.     . isosorbide mononitrate (IMDUR) 30 MG 24 hr tablet Take 1 tablet (30 mg total) by mouth daily. 90 tablet 3  . Multiple Vitamin (MULTIVITAMIN) tablet Take 1 tablet by mouth daily.    . nitroGLYCERIN (NITROSTAT) 0.4 MG SL tablet Place 1 tablet (0.4 mg total) under the tongue every 5 (five) minutes as needed for chest pain. 25 tablet 3  . pravastatin (PRAVACHOL) 80 MG tablet TAKE 1 TABLET BY MOUTH EVERY EVENING 90 tablet 2  . traMADol (ULTRAM) 50 MG tablet Take 50 mg by mouth every 6 (six) hours as needed for moderate pain or severe pain.    . Vitamin D, Ergocalciferol, (DRISDOL) 50000 units CAPS capsule Take 1 capsule by mouth once a week.  1   No current facility-administered medications for this visit.      Past Surgical History:  Procedure Laterality Date  . cardiac bypass  2001  3v  . CORONARY STENT INTERVENTION N/A 12/29/2016   Procedure: CORONARY STENT INTERVENTION;  Surgeon: Burnell Blanks, MD;  Location: [facility];  Service: Cardiovascular;  Laterality: N/A;  . multiple toe amputations  2010-2017   3 toes on the right foot, 1 on the left  . nose removal  2004   basal cell carcinoma  . RIGHT/LEFT HEART CATH AND CORONARY/GRAFT ANGIOGRAPHY N/A 12/29/2016   Procedure: RIGHT/LEFT HEART CATH AND CORONARY/GRAFT ANGIOGRAPHY;  Surgeon: Burnell Blanks, MD;  Location: [facility];  Service: Cardiovascular;  Laterality: N/A;     Allergies  Allergen Reactions  . Nitroglycerin     NOTE: Patient states that she can take the tablets but is not able to use the patches      Family History  Problem Relation Age of Onset  . CAD Mother 77  . Diabetes Sister   .  Hypertension Sister   . CAD Brother      Social History Tamara Carpenter reports that she has been smoking cigarettes. She started smoking about 49 years ago. She has a 24.00 pack-year smoking history. She has never used smokeless tobacco. Tamara Carpenter reports no history of alcohol use.   Review of Systems CONSTITUTIONAL: No weight loss, fever, chills, weakness or fatigue.  HEENT: Eyes: No visual loss, blurred vision, double vision or yellow sclerae.No hearing loss, sneezing, congestion, runny nose or sore throat.  SKIN: No rash or itching.  CARDIOVASCULAR: per hpi RESPIRATORY: No shortness of breath, cough or sputum.  GASTROINTESTINAL: No anorexia, nausea, vomiting or diarrhea. No abdominal pain or blood.  GENITOURINARY: No burning on urination, no polyuria NEUROLOGICAL: No headache, dizziness, syncope, paralysis, ataxia, numbness or tingling in the extremities. No change in bowel or bladder control.  MUSCULOSKELETAL: No muscle, back pain, joint pain or stiffness.  LYMPHATICS: No enlarged nodes. No history of splenectomy.  PSYCHIATRIC: No history of depression or anxiety.  ENDOCRINOLOGIC: No reports of sweating, cold or heat intolerance. No polyuria or polydipsia.  Marland Kitchen   Physical Examination Vitals:   06/11/18 1120  BP: (!) 143/81  Pulse: 79  SpO2: 98%   Vitals:   06/11/18 1120  Weight: 212 lb 3.2 oz (96.3 kg)  Height: 5\' 7"  (1.702 m)    Gen: resting comfortably, no acute distress HEENT: no scleral icterus, pupils equal round and reactive, no palptable cervical adenopathy,  CV: RRR, no m/r/g, no jvd Resp: Clear to auscultation bilaterally GI: abdomen is soft, non-tender, non-distended, normal bowel sounds, no hepatosplenomegaly MSK: extremities are warm, no edema.  Skin: warm, no rash Neuro:  no focal deficits Psych: appropriate affect   Diagnostic Studies 12/2016 RHC/LHC  SVG graft was visualized by angiography.  Ost RCA to Prox RCA lesion, 100 %stenosed.  Prox Cx to Mid  Cx lesion, 100 %stenosed.  SVG graft was visualized by angiography.  A STENT RESOLUTE ONYX 3.5X18 drug eluting stent was successfully placed.  Dist Graft lesion, 99 %stenosed.  Post intervention, there is a 0% residual stenosis.  Prox Graft lesion, 99 %stenosed.  Post intervention, there is a 0% residual stenosis.  A STENT RESOLUTE ONYX 3.5X12 drug eluting stent was successfully placed.  Prox LAD to Mid LAD lesion, 99 %stenosed.  LIMA graft was visualized by angiography.  LV end diastolic pressure is normal.  Mid LAD to Dist LAD lesion, 30 %stenosed.  1. Severe triple vessel CAD s/p 3V CABG with 3/3 patent bypass grafts 2. The mid LAD has high grade stenosis. The mid and  distal LAD fills from the patent LIMA graft 3. The Circumflex is occluded in the mid segment. The OM fills from the patent vein graft. The proximal body of the vein graft has a 99% stenosis.  4. Successful PTCA/DES x 2 proximal body of SVG to OM 5. The RCA is proximally occluded. The vein graft to the distal RCA has a 99% stenosis.  6. Successful PTCA/DES x 1 distal body of SVG to RCA  Recommendations: DAPT with ASA and Brilinta for one year. Continue statin and beta blocker  12/2016 echo Study Conclusions  - Left ventricle: The cavity size was normal. Wall thickness was increased in a pattern of moderate LVH. Systolic function was severely reduced. The estimated ejection fraction was in the range of 25% to 30%. Doppler parameters are consistent with restrictive physiology, indicative of decreased left ventricular diastolic compliance and/or increased left atrial pressure. Doppler parameters are consistent with high ventricular filling pressure. - Aortic valve: Valve area (VTI): 2.99 cm^2. Valve area (Vmax): 2.53 cm^2. Valve area (Vmean): 2.95 cm^2. - Mitral valve: There was mild regurgitation. - Left atrium: The atrium was severely dilated. - Right ventricle: Systolic function was  mildly reduced. - Right atrium: The atrium was mildly dilated. - Inferior vena cava: The vessel was dilated. The respirophasic diameter changes were blunted (<50%), consistent with elevated central venous pressure. - Technically difficult study. Consider limited contrast study to more precisely describe her LV function and wall motion.  10/2017 echo Study Conclusions  - Left ventricle: Images are limited - suggest Definity contrast study for further evaluation. The cavity size was normal. There was severe focal basal hypertrophy of the septum. The estimated ejection fraction was approximately 35%. There is hypokinesis of the mid-apicalanteroseptal and apical myocardium. Doppler parameters are consistent with abnormal left ventricular relaxation (grade 1 diastolic dysfunction). - Mitral valve: Mildly calcified annulus. There was mild regurgitation. - Right atrium: Central venous pressure (est): 3 mm Hg. - Atrial septum: No defect or patent foramen ovale was identified. - Tricuspid valve: There was trivial regurgitation. - Pulmonary arteries: Systolic pressure could not be accurately estimated. - Pericardium, extracardiac: A prominent pericardial fat pad was present.    Assessment and Plan   1. CAD - no recent symptoms, continue current meds   2. Chronic systolic HF - repeat echo - off ACE-I due to prior AKI, unclear if related to some renal artery stenosis noted on prior CT imaging - f/u repeat echo, if persistent dysfunction would consider renal artery [place] to further evaluate, if not significant retry low dose ACE-I. Would also need ICD consideration if LVEF <35%  3. HTN - elevated in clinic, has been at goal during other clinic visits last few months - continue current meds - may titrate CHF meds further pending echo results.   F/u pending echo results    Arnoldo Lenis, M.D.

## 2018-06-11 NOTE — Telephone Encounter (Signed)
°  Precert needed for: 2 D Echo dx: chronic systolic heart failure   Location: [facility]     Date: Jun 21, 2018

## 2018-06-21 ENCOUNTER — Ambulatory Visit (INDEPENDENT_AMBULATORY_CARE_PROVIDER_SITE_OTHER): Payer: Medicare Other

## 2018-06-21 ENCOUNTER — Other Ambulatory Visit: Payer: Self-pay

## 2018-06-21 DIAGNOSIS — I5022 Chronic systolic (congestive) heart failure: Secondary | ICD-10-CM

## 2018-06-22 ENCOUNTER — Telehealth: Payer: Self-pay | Admitting: Cardiology

## 2018-06-22 ENCOUNTER — Telehealth: Payer: Self-pay | Admitting: *Deleted

## 2018-06-22 DIAGNOSIS — I5022 Chronic systolic (congestive) heart failure: Secondary | ICD-10-CM

## 2018-06-22 NOTE — Telephone Encounter (Signed)
°  Precert needed for: Limited echo  Chronic systolic heart failure [facility]) [I50.22]   Location: [facility]     Date: Jun 29, 2018

## 2018-06-22 NOTE — Telephone Encounter (Signed)
-----   Message from Arnoldo Lenis, MD sent at 06/22/2018 11:36 AM EST ----- Echo with somewhat difficult pictures, it appears her heart function may have improved. Would like to get a limited echo with contrast to evaluate LV function at [facility] to be sure, please order   J BrancH MD

## 2018-06-22 NOTE — Telephone Encounter (Signed)
Patient informed and verbalized understanding of plan. 

## 2018-06-29 ENCOUNTER — Ambulatory Visit (HOSPITAL_COMMUNITY)
Admission: RE | Admit: 2018-06-29 | Discharge: 2018-06-29 | Disposition: A | Discharge status: Home / Self Care | Payer: Medicare Other | Source: Ambulatory Visit | Attending: Cardiology | Admitting: Cardiology

## 2018-06-29 DIAGNOSIS — I5022 Chronic systolic (congestive) heart failure: Secondary | ICD-10-CM

## 2018-06-29 NOTE — Progress Notes (Signed)
*  PRELIMINARY RESULTS* Echocardiogram Limited 2-D Echocardiogram has been performed with Definity.  Samuel Germany 06/29/2018, 1:53 PM

## 2018-07-06 ENCOUNTER — Telehealth: Payer: Self-pay | Admitting: *Deleted

## 2018-07-06 NOTE — Telephone Encounter (Signed)
-----   Message from Merlene Laughter, RN sent at 07/06/2018  4:00 PM EST -----  ----- Message ----- From: Arnoldo Lenis, MD Sent: 07/06/2018   3:56 PM EST To: Merlene Laughter, RN  Echo looks good, heart function has nomralized. F/u 4 months   Zandra Abts MD

## 2018-07-06 NOTE — Telephone Encounter (Signed)
Pt voiced understanding - 4 month f/u scheduled - routed to pcp  

## 2018-07-12 MED ORDER — PERFLUTREN LIPID MICROSPHERE
1.0000 mL | INTRAVENOUS | Status: AC | PRN
  Administered 2018-06-29: 3 mL via INTRAVENOUS

## 2018-07-27 ENCOUNTER — Ambulatory Visit (INDEPENDENT_AMBULATORY_CARE_PROVIDER_SITE_OTHER): Payer: Medicare Other | Admitting: Physician Assistant

## 2018-07-27 ENCOUNTER — Ambulatory Visit (HOSPITAL_COMMUNITY)
Admission: RE | Admit: 2018-07-27 | Discharge: 2018-07-27 | Disposition: A | Discharge status: Home / Self Care | Payer: Medicare Other | Source: Ambulatory Visit | Attending: Vascular Surgery | Admitting: Vascular Surgery

## 2018-07-27 ENCOUNTER — Other Ambulatory Visit: Payer: Self-pay

## 2018-07-27 ENCOUNTER — Encounter: Payer: Self-pay | Admitting: Family

## 2018-07-27 VITALS — BP 117/62 | HR 70 | Temp 96.8°F | Resp 14 | Ht 67.0 in | Wt 214.9 lb

## 2018-07-27 DIAGNOSIS — I2510 Atherosclerotic heart disease of native coronary artery without angina pectoris: Secondary | ICD-10-CM | POA: Diagnosis not present

## 2018-07-27 DIAGNOSIS — I714 Abdominal aortic aneurysm, without rupture, unspecified: Secondary | ICD-10-CM

## 2018-07-27 DIAGNOSIS — I739 Peripheral vascular disease, unspecified: Secondary | ICD-10-CM

## 2018-07-27 NOTE — Progress Notes (Signed)
History of Present Illness:  Patient is a 68 y.o. year old female who presents for evaluation of abdominal aortic aneurysm and bilateral LE PAD.   Tamara Carpenter is a 68 y.o. female presents for evaluation of infrarenal aortic aneurysm.  Her mother had a brain aneurysm but there is no other history of aneurysms in her family.  She is a chronic lifelong smoker.  She does have previous amputations of 2-1/2 toes on the right and her great toe on the left.  She has a history of cardiac bypass followed by stenting  as recently as August 2018.  She has a history of bilateral SFA stenting.  He AAA was found on CT for colitis.  Last AAA measurement was 3.80 on 01/26/2018.             Past Medical History:  Diagnosis Date  . Back pain   . 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 CAD (coronary artery disease)    a. CABG in 2001 b. cath 12/29/16 - Successful PTCA/DES x 2 proximal body of SVG to OM & PTCA/DES x 1 distal body of SVG to RCA.   . Diabetes mellitus without complication ([place])   . Hypertension   . PVD (peripheral vascular disease) ([place])    has stents in both legs    Past Surgical History:  Procedure Laterality Date  . cardiac bypass  2001   3v  . CORONARY STENT INTERVENTION N/A 12/29/2016   Procedure: CORONARY STENT INTERVENTION;  Surgeon: Burnell Blanks, MD;  Location: [facility];  Service: Cardiovascular;  Laterality: N/A;  . multiple toe amputations  2010-2017   3 toes on the right foot, 1 on the left  . nose removal  2004   basal cell carcinoma  . RIGHT/LEFT HEART CATH AND CORONARY/GRAFT ANGIOGRAPHY N/A 12/29/2016   Procedure: RIGHT/LEFT HEART CATH AND CORONARY/GRAFT ANGIOGRAPHY;  Surgeon: Burnell Blanks, MD;  Location: [facility];  Service: Cardiovascular;  Laterality: N/A;     Social History Social History   Tobacco Use  . Smoking status: Heavy Tobacco Smoker    Packs/day: 0.50    Years: 48.00    Pack years:  24.00    Types: Cigarettes    Start date: 09/28/1968  . Smokeless tobacco: Never Used  Substance Use Topics  . Alcohol use: No  . Drug use: No    Family History Family History  Problem Relation Age of Onset  . CAD Mother 59  . Diabetes Sister   . Hypertension Sister   . CAD Brother     Allergies  Allergies  Allergen Reactions  . Nitroglycerin     NOTE: Patient states that she can take the tablets but is not able to use the patches     Current Outpatient Medications  Medication Sig Dispense Refill  . aspirin EC 81 MG tablet Take 81 mg by mouth daily.    . furosemide (LASIX) 20 MG tablet Take 1 tablet (20 mg total) by mouth daily as needed. 30 tablet 3  . insulin degludec (TRESIBA FLEXTOUCH) 100 UNIT/ML SOPN FlexTouch Pen Inject 20 Units into the skin daily at 10 pm.     . Multiple Vitamin (MULTIVITAMIN) tablet Take 1 tablet by mouth daily.    . nitroGLYCERIN (NITROSTAT) 0.4 MG SL tablet Place 1 tablet (0.4 mg total) under the tongue every 5 (five) minutes as needed for chest pain. 25 tablet 3  .  pravastatin (PRAVACHOL) 80 MG tablet TAKE 1 TABLET BY MOUTH EVERY EVENING 90 tablet 2  . traMADol (ULTRAM) 50 MG tablet Take 50 mg by mouth every 6 (six) hours as needed for moderate pain or severe pain.    . Vitamin D, Ergocalciferol, (DRISDOL) 50000 units CAPS capsule Take 1 capsule by mouth once a week.  1  . carvedilol (COREG) 25 MG tablet Take 1 tablet (25 mg total) by mouth 2 (two) times daily. 180 tablet 3  . hydrALAZINE (APRESOLINE) 25 MG tablet Take 1 tablet (25 mg total) by mouth 3 (three) times daily. 270 tablet 3  . isosorbide mononitrate (IMDUR) 30 MG 24 hr tablet Take 1 tablet (30 mg total) by mouth daily. 90 tablet 3   No current facility-administered medications for this visit.     ROS:   General:  No weight loss, Fever, chills  HEENT: No recent headaches, no nasal bleeding, no visual changes, no sore throat  Neurologic: No dizziness, blackouts, seizures. No  recent symptoms of stroke or mini- stroke. No recent episodes of slurred speech, or temporary blindness.  Cardiac: No recent episodes of chest pain/pressure, no shortness of breath at rest.  No shortness of breath with exertion.  Denies history of atrial fibrillation or irregular heartbeat  Vascular: No history of rest pain in feet.  No history of claudication.  No history of non-healing ulcer, No history of DVT   Pulmonary: No home oxygen, no productive cough, no hemoptysis,  No asthma or wheezing  Musculoskeletal:  [ ]  Arthritis, [ ]  Low back pain,  [ ]  Joint pain  Hematologic:No history of hypercoagulable state.  No history of easy bleeding.  No history of anemia  Gastrointestinal: No hematochezia or melena,  No gastroesophageal reflux, no trouble swallowing  Urinary: [ ]  chronic Kidney disease, [ ]  on HD - [ ]  MWF or [ ]  TTHS, [ ]  Burning with urination, [ ]  Frequent urination, [ ]  Difficulty urinating;   Skin: No rashes  Psychological: No history of anxiety,  No history of depression   Physical Examination  Vitals:   07/27/18 1106  BP: 117/62  Pulse: 70  Resp: 14  Temp: (!) 96.8 F (36 C)  TempSrc: Oral  SpO2: 97%  Weight: 214 lb 15.2 oz (97.5 kg)  Height: 5\' 7"  (1.702 m)    Body mass index is 33.67 kg/m.  General:  Alert and oriented, no acute distress HEENT: Normal Neck: No bruit or JVD Pulmonary: Clear to auscultation bilaterally Cardiac: Regular Rate and Rhythm without murmur Gastrointestinal: Soft, non-tender, non-distended, no mass, no scars Skin: No rash, feet are warm to touch without skin breakdown. Extremity Pulses:  2+ radial, brachial,  Non palpable femoral, dorsalis pedis, posterior tibial pulses bilaterally Musculoskeletal: No deformity or edema  Neurologic: Upper and lower extremity motor grossly intact and symmetric  DATA:  Abdominal Aorta Findings: +-----------+-------+----------+----------+--------+--------+--------+ Location   AP  (cm)Trans (cm)PSV (cm/s)WaveformThrombusComments +-----------+-------+----------+----------+--------+--------+--------+ Proximal   2.48   2.41      79        biphasic                 +-----------+-------+----------+----------+--------+--------+--------+ Mid        3.85   3.61      76        biphasic                 +-----------+-------+----------+----------+--------+--------+--------+ Distal     2.17   1.97      95  biphasic                 +-----------+-------+----------+----------+--------+--------+--------+ RT CIA Prox1.8    2.0       83        biphasic                 +-----------+-------+----------+----------+--------+--------+--------+ LT CIA Prox1.8    1.9       112       biphasic                 +-----------+-------+----------+----------+--------+--------+--------+     Summary: Abdominal Aorta: Mid fusiform aortic aneurysm mesuring 3.85 x 3.61 cms     ABI Findings: +---------+------------------+-----+----------+---------+ Right    Rt Pressure (mmHg)IndexWaveform  Comment   +---------+------------------+-----+----------+---------+ Brachial 155                                        +---------+------------------+-----+----------+---------+ PTA      105               0.65 monophasic          +---------+------------------+-----+----------+---------+ DP       57                0.35 monophasic          +---------+------------------+-----+----------+---------+ Great Toe                                 amputated +---------+------------------+-----+----------+---------+  +---------+------------------+-----+----------+---------+ Left     Lt Pressure (mmHg)IndexWaveform  Comment   +---------+------------------+-----+----------+---------+ Brachial 161                                        +---------+------------------+-----+----------+---------+ PTA      104               0.65  monophasic          +---------+------------------+-----+----------+---------+ DP       98                0.61 monophasic          +---------+------------------+-----+----------+---------+ Great Toe                                 amputated +---------+------------------+-----+----------+---------+  +-------+-----------+-----------+------------+------------+ ABI/TBIToday's ABIToday's TBIPrevious ABIPrevious TBI +-------+-----------+-----------+------------+------------+ Right  0.65       -                                   +-------+-----------+-----------+------------+------------+ Left   0.65       -                                   +-------+-----------+-----------+------------+------------+   ASSESSMENT:  AAA essentially the same without significant change in diameter.  3.85 maximum diameter on Duplex. ABI 65% B without non healing wounds.  Bilateral healed amputation toe sites.    PLAN: She appears to be in a stable state.  We will have her return in 6 months for repeat AAA duplex and ABI's B. If she has  problems or concerns she will call sooner.  If there is any change she will need a repeat CT angios to evaluate.  She would likely require complex endovascular grafting with either parallel grafting or fenestrated repair given what is an apparent unhealthy neck below the renal arteries.   Roxy Horseman PA-C [facility] Office: [phone]  MD in clinic [place]

## 2018-09-10 ENCOUNTER — Ambulatory Visit: Payer: Medicare Other | Admitting: "Endocrinology

## 2018-10-29 ENCOUNTER — Ambulatory Visit: Payer: Medicare Other | Admitting: "Endocrinology

## 2018-11-05 ENCOUNTER — Telehealth (INDEPENDENT_AMBULATORY_CARE_PROVIDER_SITE_OTHER): Payer: Medicare Other | Admitting: Cardiology

## 2018-11-05 ENCOUNTER — Encounter: Payer: Self-pay | Admitting: *Deleted

## 2018-11-05 ENCOUNTER — Encounter: Payer: Self-pay | Admitting: Cardiology

## 2018-11-05 VITALS — Ht 67.0 in | Wt 213.0 lb

## 2018-11-05 DIAGNOSIS — I2510 Atherosclerotic heart disease of native coronary artery without angina pectoris: Secondary | ICD-10-CM | POA: Diagnosis not present

## 2018-11-05 DIAGNOSIS — E782 Mixed hyperlipidemia: Secondary | ICD-10-CM

## 2018-11-05 DIAGNOSIS — I5022 Chronic systolic (congestive) heart failure: Secondary | ICD-10-CM

## 2018-11-05 NOTE — Progress Notes (Signed)
Virtual Visit via Telephone Note   This visit type was conducted due to national recommendations for restrictions regarding the COVID-19 Pandemic (e.g. social distancing) in an effort to limit this patient's exposure and mitigate transmission in our community.  Due to her co-morbid illnesses, this patient is at least at moderate risk for complications without adequate follow up.  This format is felt to be most appropriate for this patient at this time.  The patient did not have access to video technology/had technical difficulties with video requiring transitioning to audio format only (telephone).  All issues noted in this document were discussed and addressed.  No physical exam could be performed with this format.  Please refer to the patient's chart for her  consent to telehealth for [facility].   Date:  11/05/2018   ID:  Tamara Carpenter, DOB January 06, 1951, MRN [id number]  Patient Location: Home Provider Location: Office  PCP:  Sander Radon, NP  Cardiologist:  Carlyle Dolly, MD  Electrophysiologist:  None   Evaluation Performed:  Follow-Up Visit  Chief Complaint:  4 month follow up  History of Present Illness:    Tamara Carpenter is a 68 y.o. female seen today for follow up of the following medical problems   1. CAD/Chronic systolic heart failure - previously followed by Dr Dossie Der in [place], [place] - history of prior CABG in 2001 - admitted 12/2016 with signs of CHF, elevated trop to 0.54. - 12/2016 echo LVEF 25-30%, abnormal diastolic function, mild RV dysfunction -12/2016 RHC/LHC: severe 3 vessel disease. 99% stenosis in SVG to OM, received DES x 2 and DES to SVG to RCA.  10/2017 echo LVEF 25%, grade I diastolic dysfunction   - AKI 01/2018 with Cr up to 1.58. ACE-I stopped at that time. Started on hydral.imdur instead. Note had some renal artery stenosis by prior CT, unclear if related.    Jan 2020 echo: LVEF 36-64%, grade I diastolic dysfunction - Jan 2020 echo with  contrast : LVEF 55-60%  - no recent chest pain, no SOB or DOE - compliant with meds    2. HTN -compliant with meds - does not have a home monitor  3. PAD - prior interventions - followed by vascular  4. AAA - incidental finding during 11/2017 ER visit.  - 11/2017 CT with 4.6 cm AAA, followed by vascular.   5. Renal artery stenosis - noted by 11/2017 CTA A/P     The patient does not have symptoms concerning for COVID-19 infection (fever, chills, cough, or new shortness of breath).    Past Medical History:  Diagnosis Date  . Back pain   . 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 CAD (coronary artery disease)    a. CABG in 2001 b. cath 12/29/16 - Successful PTCA/DES x 2 proximal body of SVG to OM & PTCA/DES x 1 distal body of SVG to RCA.   . Diabetes mellitus without complication ([place])   . Hypertension   . PVD (peripheral vascular disease) ([place])    has stents in both legs   Past Surgical History:  Procedure Laterality Date  . cardiac bypass  2001   3v  . CORONARY STENT INTERVENTION N/A 12/29/2016   Procedure: CORONARY STENT INTERVENTION;  Surgeon: Burnell Blanks, MD;  Location: [facility];  Service: Cardiovascular;  Laterality: N/A;  . multiple toe amputations  2010-2017   3 toes on the right foot, 1 on the left  .  nose removal  2004   basal cell carcinoma  . RIGHT/LEFT HEART CATH AND CORONARY/GRAFT ANGIOGRAPHY N/A 12/29/2016   Procedure: RIGHT/LEFT HEART CATH AND CORONARY/GRAFT ANGIOGRAPHY;  Surgeon: Burnell Blanks, MD;  Location: [facility];  Service: Cardiovascular;  Laterality: N/A;     Current Meds  Medication Sig  . aspirin EC 81 MG tablet Take 81 mg by mouth daily.  . carvedilol (COREG) 25 MG tablet Take 1 tablet (25 mg total) by mouth 2 (two) times daily.  . furosemide (LASIX) 20 MG tablet Take 1 tablet (20 mg total) by mouth daily as needed.  . hydrALAZINE (APRESOLINE) 25 MG tablet Take 1  tablet (25 mg total) by mouth 3 (three) times daily.  . insulin degludec (TRESIBA FLEXTOUCH) 100 UNIT/ML SOPN FlexTouch Pen Inject 20 Units into the skin daily at 10 pm.   . isosorbide mononitrate (IMDUR) 30 MG 24 hr tablet Take 1 tablet (30 mg total) by mouth daily.  . Multiple Vitamin (MULTIVITAMIN) tablet Take 1 tablet by mouth daily.  . nitroGLYCERIN (NITROSTAT) 0.4 MG SL tablet Place 1 tablet (0.4 mg total) under the tongue every 5 (five) minutes as needed for chest pain.  . pravastatin (PRAVACHOL) 80 MG tablet TAKE 1 TABLET BY MOUTH EVERY EVENING  . traMADol (ULTRAM) 50 MG tablet Take 50 mg by mouth every 6 (six) hours as needed for moderate pain or severe pain.  . TRESIBA FLEXTOUCH 200 UNIT/ML SOPN Inject 20 Units into the skin daily.  . Vitamin D, Ergocalciferol, (DRISDOL) 50000 units CAPS capsule Take 1 capsule by mouth once a week.     Allergies:   Nitroglycerin   Social History   Tobacco Use  . Smoking status: Heavy Tobacco Smoker    Packs/day: 0.50    Years: 48.00    Pack years: 24.00    Types: Cigarettes    Start date: 09/28/1968  . Smokeless tobacco: Never Used  Substance Use Topics  . Alcohol use: No  . Drug use: No     Family Hx: The patient's family history includes CAD in her brother; CAD (age of onset: 64) in her mother; Diabetes in her sister; Hypertension in her sister.  ROS:   Please see the history of present illness.     All other systems reviewed and are negative.   Prior CV studies:   The following studies were reviewed today:  12/2016 RHC/LHC  SVG graft was visualized by angiography.  Ost RCA to Prox RCA lesion, 100 %stenosed.  Prox Cx to Mid Cx lesion, 100 %stenosed.  SVG graft was visualized by angiography.  A STENT RESOLUTE ONYX 3.5X18 drug eluting stent was successfully placed.  Dist Graft lesion, 99 %stenosed.  Post intervention, there is a 0% residual stenosis.  Prox Graft lesion, 99 %stenosed.  Post intervention, there is a 0%  residual stenosis.  A STENT RESOLUTE ONYX 3.5X12 drug eluting stent was successfully placed.  Prox LAD to Mid LAD lesion, 99 %stenosed.  LIMA graft was visualized by angiography.  LV end diastolic pressure is normal.  Mid LAD to Dist LAD lesion, 30 %stenosed.  1. Severe triple vessel CAD s/p 3V CABG with 3/3 patent bypass grafts 2. The mid LAD has high grade stenosis. The mid and distal LAD fills from the patent LIMA graft 3. The Circumflex is occluded in the mid segment. The OM fills from the patent vein graft. The proximal body of the vein graft has a 99% stenosis.  4. Successful PTCA/DES x 2 proximal  body of SVG to OM 5. The RCA is proximally occluded. The vein graft to the distal RCA has a 99% stenosis.  6. Successful PTCA/DES x 1 distal body of SVG to RCA  Recommendations: DAPT with ASA and Brilinta for one year. Continue statin and beta blocker  12/2016 echo Study Conclusions  - Left ventricle: The cavity size was normal. Wall thickness was increased in a pattern of moderate LVH. Systolic function was severely reduced. The estimated ejection fraction was in the range of 25% to 30%. Doppler parameters are consistent with restrictive physiology, indicative of decreased left ventricular diastolic compliance and/or increased left atrial pressure. Doppler parameters are consistent with high ventricular filling pressure. - Aortic valve: Valve area (VTI): 2.99 cm^2. Valve area (Vmax): 2.53 cm^2. Valve area (Vmean): 2.95 cm^2. - Mitral valve: There was mild regurgitation. - Left atrium: The atrium was severely dilated. - Right ventricle: Systolic function was mildly reduced. - Right atrium: The atrium was mildly dilated. - Inferior vena cava: The vessel was dilated. The respirophasic diameter changes were blunted (<50%), consistent with elevated central venous pressure. - Technically difficult study. Consider limited contrast study to more precisely  describe her LV function and wall motion.  10/2017 echo Study Conclusions  - Left ventricle: Images are limited - suggest Definity contrast study for further evaluation. The cavity size was normal. There was severe focal basal hypertrophy of the septum. The estimated ejection fraction was approximately 35%. There is hypokinesis of the mid-apicalanteroseptal and apical myocardium. Doppler parameters are consistent with abnormal left ventricular relaxation (grade 1 diastolic dysfunction). - Mitral valve: Mildly calcified annulus. There was mild regurgitation. - Right atrium: Central venous pressure (est): 3 mm Hg. - Atrial septum: No defect or patent foramen ovale was identified. - Tricuspid valve: There was trivial regurgitation. - Pulmonary arteries: Systolic pressure could not be accurately estimated. - Pericardium, extracardiac: A prominent pericardial fat pad was present.  Jan 2020 echo  1. The left ventricle has normal systolic function of 71-06%. The cavity size is normal. There is not assessed left ventricular wall thickness. Echo evidence of not assessed diastolic filling patterns.  2. Not assessed left atrial size.  3. Not assessed right atrial size.  4. The mitral valve not assessed. Regurgitation not assessed.  5. Tricuspid regurgitation not assessed.  6. The aortic valve not assessed. Aortic valve regurgitation not assessed.  7. Not assessed.  Labs/Other Tests and Data Reviewed:    EKG:  No ECG reviewed.  Recent Labs: 12/14/2017: ALT 11; Hemoglobin 14.1; Platelets 264 03/09/2018: BUN 19; Creatinine, Ser 1.24; Potassium 4.9; Sodium 137   Recent Lipid Panel Lab Results  Component Value Date/Time   CHOL 181 12/28/2016 06:32 AM   TRIG 237 (H) 12/28/2016 06:32 AM   HDL 29 (L) 12/28/2016 06:32 AM   CHOLHDL 6.2 12/28/2016 06:32 AM   LDLCALC 105 (H) 12/28/2016 06:32 AM    Wt Readings from Last 3 Encounters:  11/05/18 213 lb (96.6 kg)  07/27/18  214 lb 15.2 oz (97.5 kg)  06/11/18 212 lb 3.2 oz (96.3 kg)     Objective:    Vital Signs:  Ht 5\' 7"  (1.702 m)   Wt 213 lb (96.6 kg)   BMI 33.36 kg/m    Today's Vitals   11/05/18 1209  Weight: 213 lb (96.6 kg)  Height: 5\' 7"  (1.702 m)   Body mass index is 33.36 kg/m.  Normal affect. Normal speech pattern and tone. Comfortable, no apparent distress. No audible signs of SOB or  wheezing.   ASSESSMENT & PLAN:    1. CAD -no symptoms, continue current meds - AKI on ACE-I previously, has not been on   2. Chronic systolic HF -repeat echo shows LVEF has normalized - no symptoms, continue current meds. Not on ACE-I due to prior AKI  3. HTN Continue current mes   4. Hyperlipidemia - request labs from pcp, continue statin. Has been on pravastatin long term   COVID-19 Education: The signs and symptoms of COVID-19 were discussed with the patient and how to seek care for testing (follow up with PCP or arrange E-visit).  The importance of social distancing was discussed today.  Time:   Today, I have spent 14 minutes with the patient with telehealth technology discussing the above problems.     Medication Adjustments/Labs and Tests Ordered: Current medicines are reviewed at length with the patient today.  Concerns regarding medicines are outlined above.   Tests Ordered: No orders of the defined types were placed in this encounter.   Medication Changes: No orders of the defined types were placed in this encounter.   Disposition:  Follow up 6 months  Signed, Carlyle Dolly, MD  11/05/2018 12:21 PM    [facility]

## 2018-11-05 NOTE — Patient Instructions (Addendum)
Medication Instructions:   Your physician recommends that you continue on your current medications as directed. Please refer to the Current Medication list given to you today.  Labwork:  NONE  Testing/Procedures:  NONE  Follow-Up:  Your physician recommends that you schedule a follow-up appointment in: 6 months. You will receive a reminder letter in the mail in about 1 -2 months reminding you to call and schedule your appointment. If you don't receive this letter, please contact our office.  Any Other Special Instructions Will Be Listed Below (If Applicable).  If you need a refill on your cardiac medications before your next appointment, please call your pharmacy.

## 2018-12-03 ENCOUNTER — Other Ambulatory Visit: Payer: Self-pay | Admitting: Cardiology

## 2018-12-31 IMAGING — CT CT ABD-PELV W/O CM
2 of 4 series · 15 of 46 positions shown, 17 images · non-contrast
Comparison: Chest radiograph 12/14/2017

CLINICAL DATA: 67-year-old with pain under the left breast for 4-5
days. Vomiting.

EXAM:
CT ABDOMEN AND PELVIS WITHOUT CONTRAST
TECHNIQUE: Multidetector CT imaging of the abdomen and pelvis was performed
following the standard protocol without IV contrast.

[Series 2: axial st · axial · 0.79mm/px · z∈[+1034,+1470]mm · 12 of 95 slices shown, 14 images]
[im 4/95  soft-tissue]
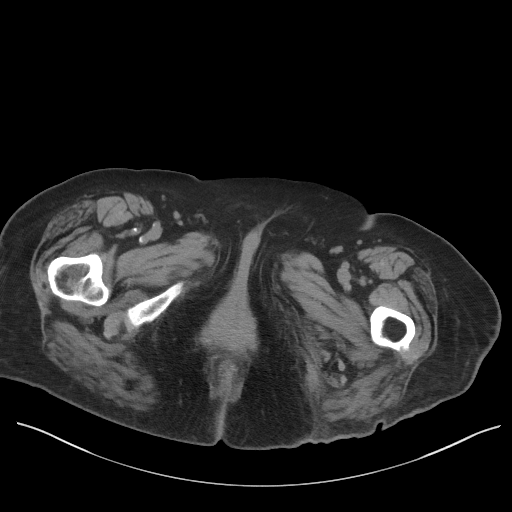
[im 4/95  bone]
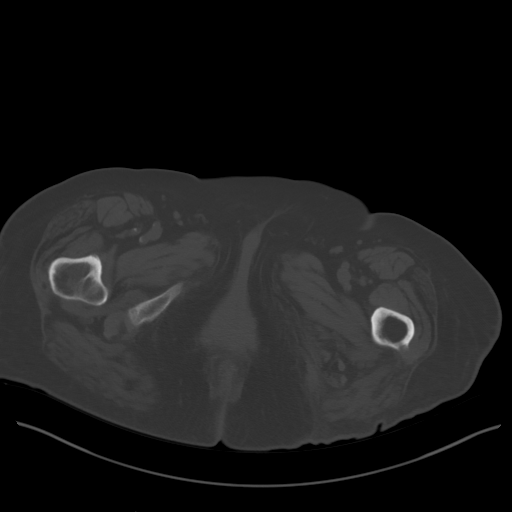
[im 12/95  soft-tissue]
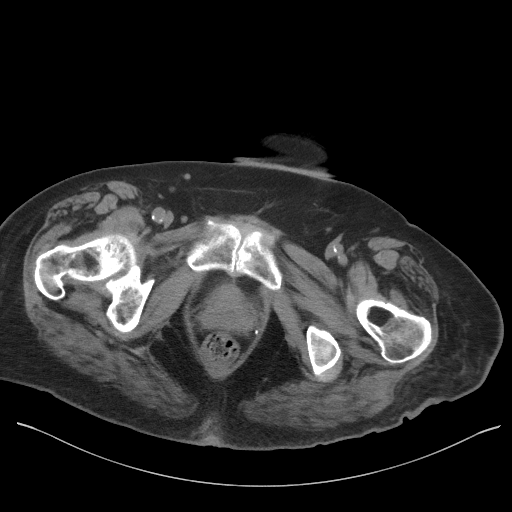
[im 20/95  soft-tissue]
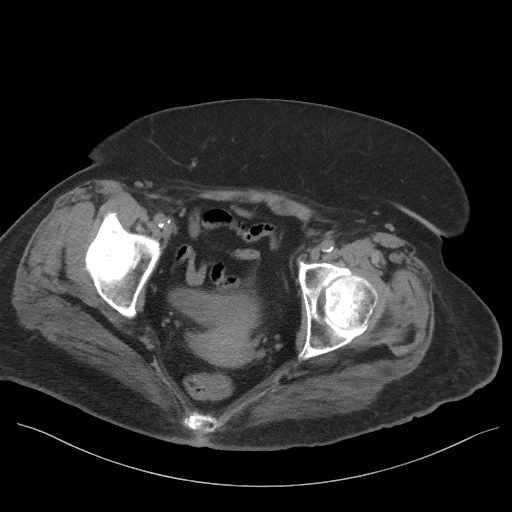
[im 28/95  soft-tissue]
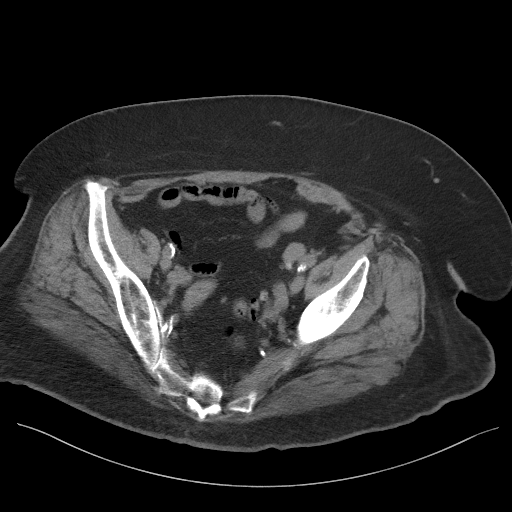
[im 36/95  soft-tissue]
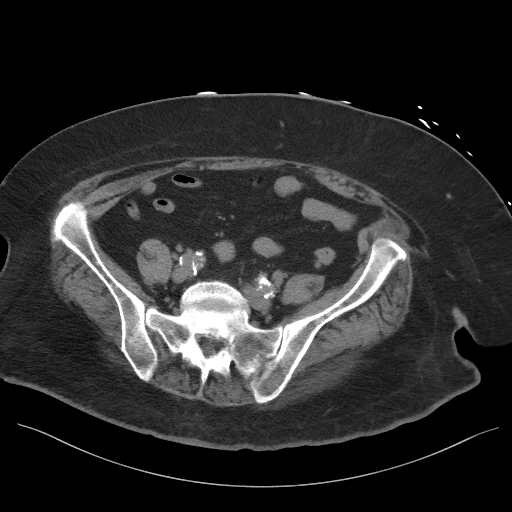
[im 44/95  soft-tissue]
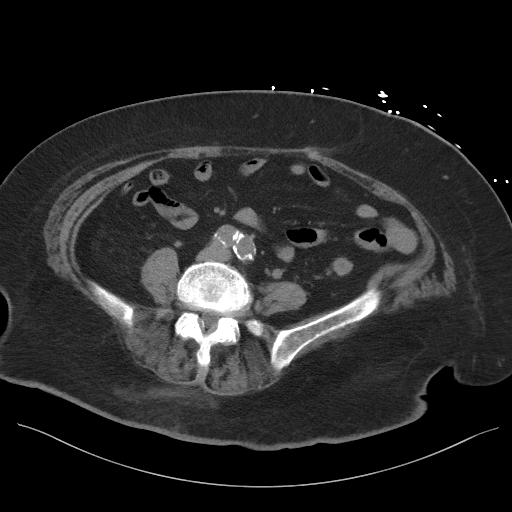
[im 51/95  soft-tissue]
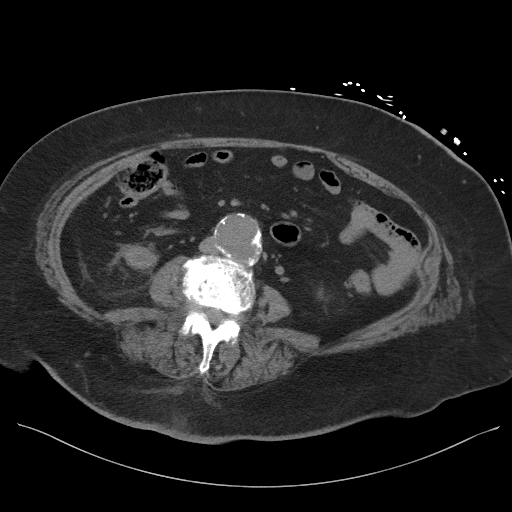
[im 59/95  soft-tissue]
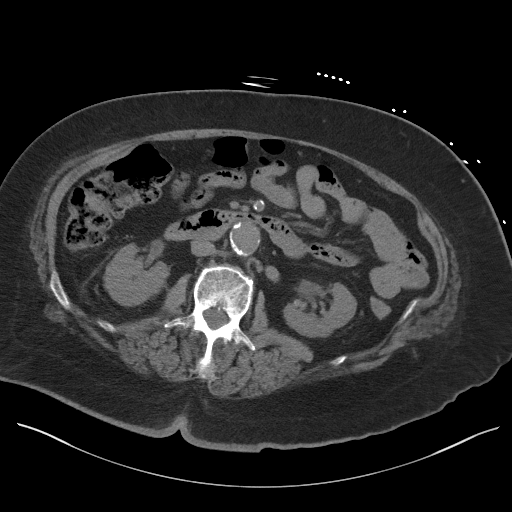
[im 67/95  soft-tissue]
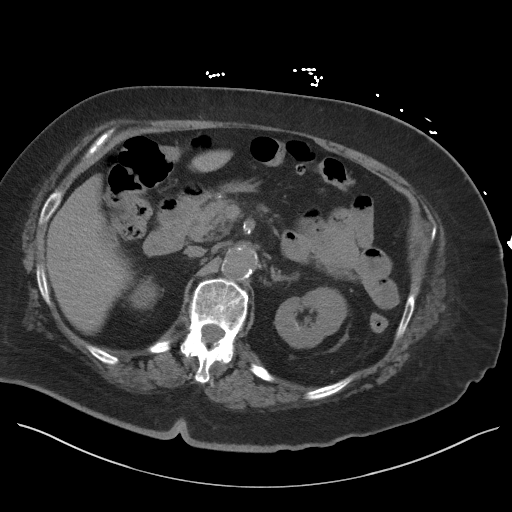
[im 67/95  bone]
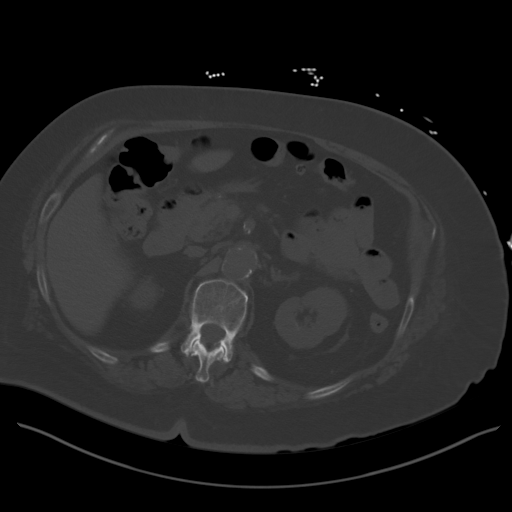
[im 75/95  soft-tissue]
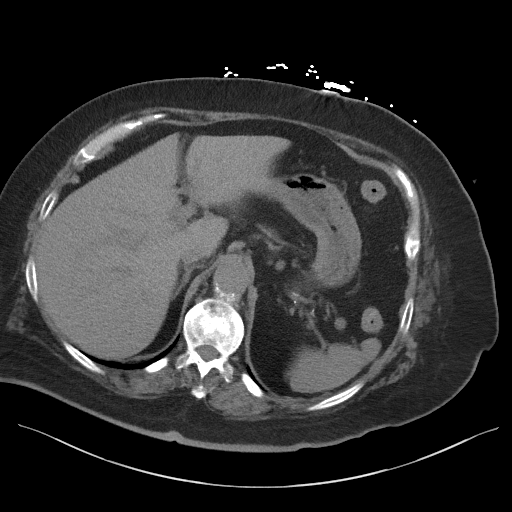
[im 83/95  soft-tissue]
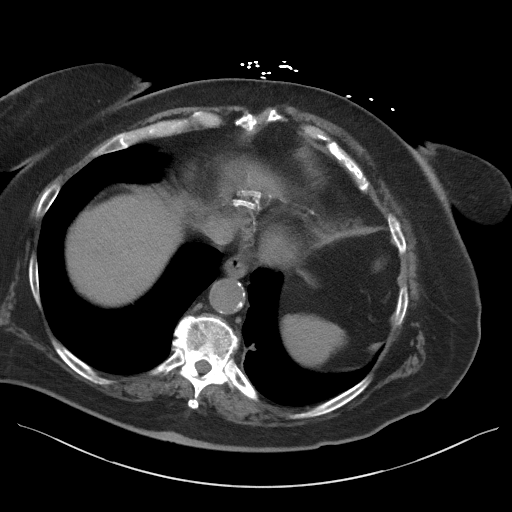
[im 91/95  soft-tissue]
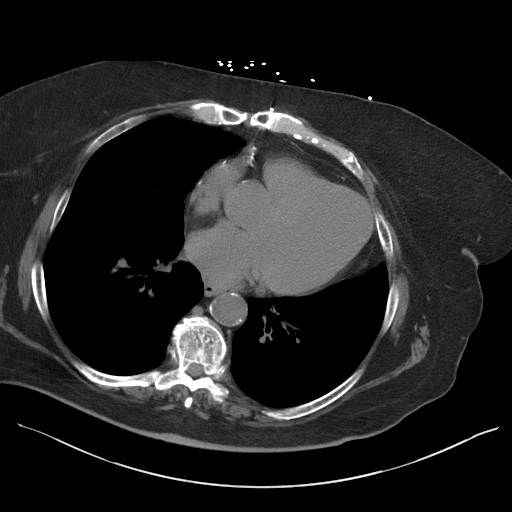

[Series 5: coronal st · coronal · 0.92mm/px · 3 of 120 slices shown]
[im 53/120  soft-tissue]
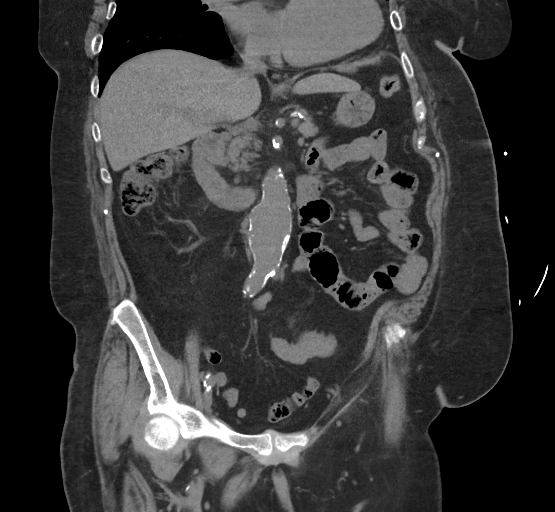
[im 67/120  soft-tissue]
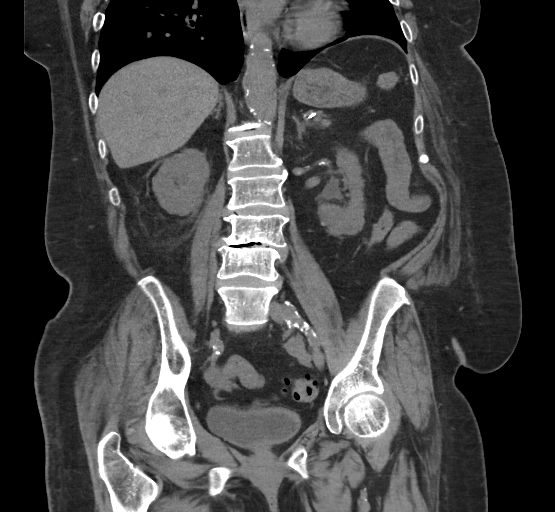
[im 80/120  soft-tissue]
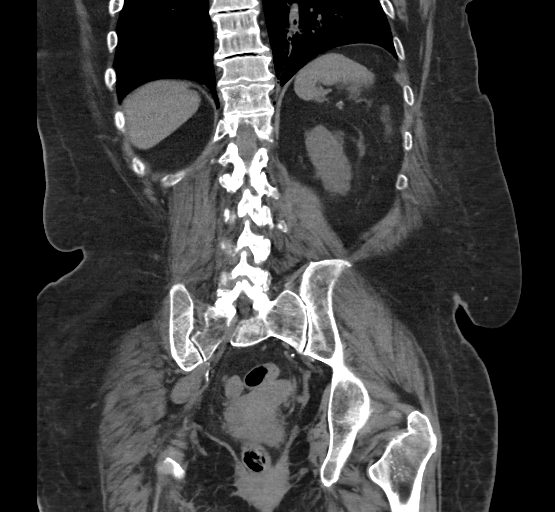

[15 of 46 positions shown; findings below may reference images not displayed]

FINDINGS: Lower chest: Few densities at the medial left lung base are probably
related to scar or atelectasis. No large pleural effusions. Coronary
artery calcifications.

Hepatobiliary: Gallbladder appears to be decompressed with small
calcified densities. Findings are suggestive for a contracted
gallbladder with stones. No inflammatory changes in the right upper
abdomen. No gross abnormality to the liver on this noncontrast
examination. No biliary dilatation.

Pancreas: Unremarkable. No pancreatic ductal dilatation or
surrounding inflammatory changes.

Spleen: Normal in size without focal abnormality.

Adrenals/Urinary Tract: Normal adrenal glands. Marked atrophy
involving the right kidney lower pole with some perinephric
densities probably representing chronic changes. Probable small cyst
involving the right kidney lower pole. Scattered calcifications in
the right kidney could be vascular. There is an extrarenal pelvis
involving the right kidney without definite hydronephrosis. Urinary
bladder is unremarkable. No evidence for ureter stones.
Calcifications iin left kidney probably represent a combination of
vascular calcifications and nonobstructive stones. Mild dilatation
of the left renal pelvis without hydronephrosis.

Stomach/Bowel: Normal appearance of the stomach and duodenum.
Diverticulosis involving the sigmoid colon without acute colonic
inflammation. Scattered colonic diverticula throughout the abdomen.
Normal appendix. No evidence for bowel obstruction.

Vascular/Lymphatic: Infrarenal abdominal aortic aneurysm measuring
up to 4.6 cm. Concern for an eccentric saccular aneurysm along the
right side of the infrarenal abdominal aorta on sequence 2, image
35. This roughly measures up to 1.3 cm. However, this may represent
adjacent lymph node tissue with calcifications. Right common iliac
artery measures up to 1.9 cm. The iliac arteries are heavily
calcified. Multiple small lymph nodes in the periaortic region and
difficult to differentiate lymph node versus saccular aneurysm along
the right infrarenal abdominal aorta. Overall, no significant lymph
node enlargement.

Reproductive: Uterus and bilateral adnexa are unremarkable.

Other: Negative for free fluid. Negative for free air. Soft tissue
thickening posterior to the sacrum and coccyx. Findings could be
related to an old decubitus ulceration.

Musculoskeletal: Lucency in both femoral necks probably related to
osteopenia. Bilateral pars defects at L5 without anterolisthesis.
Disc space narrowing with vacuum disc phenomena at L3-L4. Chronic
disc space loss at T11-T12. Chronic disc space loss at T8-T9.
IMPRESSION: Bilateral renal calcifications. Majority of the calcifications are
suggestive for vascular calcifications but cannot exclude
nonobstructive stones, particularly in the left kidney. No
hydronephrosis and no evidence for ureter stones.

Abdominal aortic aneurysm measuring up to 4.6 cm. Cannot exclude an
eccentric saccular aneurysm along the right side of the infrarenal
abdominal aorta measuring up to 1.3 cm. A follow-up CTA of the
abdomen and pelvis will be useful to further characterize the
abdominal aorta.

Gallbladder appears to be contracted with stones. No inflammatory
changes around the gallbladder.

Question decubitus ulceration, probably chronic or healed.

## 2019-03-27 ENCOUNTER — Other Ambulatory Visit: Payer: Self-pay | Admitting: Cardiology

## 2019-03-28 ENCOUNTER — Other Ambulatory Visit: Payer: Self-pay | Admitting: Cardiology

## 2019-09-11 ENCOUNTER — Other Ambulatory Visit: Payer: Self-pay

## 2019-09-11 ENCOUNTER — Encounter: Payer: Self-pay | Admitting: Cardiology

## 2019-09-11 ENCOUNTER — Ambulatory Visit (INDEPENDENT_AMBULATORY_CARE_PROVIDER_SITE_OTHER): Payer: Medicare Other | Admitting: Cardiology

## 2019-09-11 VITALS — BP 130/72 | HR 79 | Ht 67.0 in | Wt 196.2 lb

## 2019-09-11 DIAGNOSIS — E782 Mixed hyperlipidemia: Secondary | ICD-10-CM

## 2019-09-11 DIAGNOSIS — I2510 Atherosclerotic heart disease of native coronary artery without angina pectoris: Secondary | ICD-10-CM | POA: Diagnosis not present

## 2019-09-11 DIAGNOSIS — I10 Essential (primary) hypertension: Secondary | ICD-10-CM

## 2019-09-11 DIAGNOSIS — I5022 Chronic systolic (congestive) heart failure: Secondary | ICD-10-CM | POA: Diagnosis not present

## 2019-09-11 DIAGNOSIS — R0602 Shortness of breath: Secondary | ICD-10-CM

## 2019-09-11 MED ORDER — CARVEDILOL 25 MG PO TABS: 25.0000 mg | ORAL_TABLET | Freq: Two times a day (BID) | ORAL | 1 refills | Status: AC

## 2019-09-11 MED ORDER — PRAVASTATIN SODIUM 80 MG PO TABS: 80.0000 mg | ORAL_TABLET | Freq: Every evening | ORAL | 1 refills | Status: AC

## 2019-09-11 MED ORDER — HYDRALAZINE HCL 25 MG PO TABS: 25.0000 mg | ORAL_TABLET | Freq: Three times a day (TID) | ORAL | 1 refills | Status: AC

## 2019-09-11 MED ORDER — FUROSEMIDE 20 MG PO TABS: 20.0000 mg | ORAL_TABLET | Freq: Every day | ORAL | 1 refills | Status: AC | PRN

## 2019-09-11 NOTE — Progress Notes (Signed)
Clinical Summary Tamara Carpenter is a 69 y.o.female seen today for follow up of the following medical problems   1. CAD/Chronic systolic heart failure - previously followed by Dr Dossie Der in [place], [place] - history of prior CABG in 2001 - admitted 12/2016 with signs of CHF, elevated trop to 0.54. - 12/2016 echo LVEF 25-30%, abnormal diastolic function, mild RV dysfunction -12/2016 RHC/LHC: severe 3 vessel disease. 99% stenosis in SVG to OM, received DES x 2 and DES to SVG to RCA.  10/2017 echo LVEF [id number], grade I diastolic dysfunction   - AKI 01/2018 with Cr up to 1.58. ACE-I stopped at that time. Started on hydral.imdur instead.Note had some renal artery stenosis by prior CT, unclear if related.   Jan 2020 echo: LVEF [id number], grade I diastolic dysfunction - Jan 2020 echo with contrast : LVEF 55-60%  - no recent chest pain - sedentary due to chronic back pain, noticing some DOE - no exertional chest pain. Reports some LE edema - takes lasix 20mg  prn about 3 days a week.  - she stopped imdur due to low bp's. She has lost some weight.    2. HTN -stopped imdur due to low bp's - prior AKI on ACE-I   3. PAD - prior interventions - followed by vascular  4. AAA - incidental finding during 11/2017 ER visit.  - 11/2017 CT with 4.6 cm AAA, followed by vascular.   5. Renal artery stenosis - noted by 11/2017 CTA A/P  6. Current smoker - smoker x 50 years.  - has not had formal PFTs - recent SOB, coughing, wheezing.   Past Medical History:  Diagnosis Date  . Back pain   . 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 CAD (coronary artery disease)    a. CABG in 2001 b. cath 12/29/16 - Successful PTCA/DES x 2 proximal body of SVG to OM & PTCA/DES x 1 distal body of SVG to RCA.   . Diabetes mellitus without complication ([facility])   . Hypertension   . PVD (peripheral vascular disease) ([place])    has stents in both legs     Allergies  Allergen  Reactions  . Nitroglycerin     NOTE: Patient states that she can take the tablets but is not able to use the patches     Current Outpatient Medications  Medication Sig Dispense Refill  . aspirin EC 81 MG tablet Take 81 mg by mouth daily.    . carvedilol (COREG) 25 MG tablet Take 1 tablet (25 mg total) by mouth 2 (two) times daily. 180 tablet 3  . furosemide (LASIX) 20 MG tablet Take 1 tablet (20 mg total) by mouth daily as needed. 30 tablet 3  . hydrALAZINE (APRESOLINE) 25 MG tablet Take 1 tablet (25 mg total) by mouth 3 (three) times daily. 270 tablet 3  . insulin degludec (TRESIBA FLEXTOUCH) 100 UNIT/ML SOPN FlexTouch Pen Inject 20 Units into the skin daily at 10 pm.     . isosorbide mononitrate (IMDUR) 30 MG 24 hr tablet TAKE 1 TABLET BY MOUTH ONCE DAILY. 90 tablet 0  . Multiple Vitamin (MULTIVITAMIN) tablet Take 1 tablet by mouth daily.    . nitroGLYCERIN (NITROSTAT) 0.4 MG SL tablet Place 1 tablet (0.4 mg total) under the tongue every 5 (five) minutes as needed for chest pain. 25 tablet 3  . pravastatin (PRAVACHOL) 80 MG tablet TAKE 1 TABLET BY MOUTH EVERY EVENING 90 tablet 1  .  traMADol (ULTRAM) 50 MG tablet Take 50 mg by mouth every 6 (six) hours as needed for moderate pain or severe pain.    . TRESIBA FLEXTOUCH 200 UNIT/ML SOPN Inject 20 Units into the skin daily.    . Vitamin D, Ergocalciferol, (DRISDOL) 50000 units CAPS capsule Take 1 capsule by mouth once a week.  1   No current facility-administered medications for this visit.     Past Surgical History:  Procedure Laterality Date  . cardiac bypass  2001   3v  . CORONARY STENT INTERVENTION N/A 12/29/2016   Procedure: CORONARY STENT INTERVENTION;  Surgeon: Burnell Blanks, MD;  Location: [facility];  Service: Cardiovascular;  Laterality: N/A;  . multiple toe amputations  2010-2017   3 toes on the right foot, 1 on the left  . nose removal  2004   basal cell carcinoma  . RIGHT/LEFT HEART CATH AND  CORONARY/GRAFT ANGIOGRAPHY N/A 12/29/2016   Procedure: RIGHT/LEFT HEART CATH AND CORONARY/GRAFT ANGIOGRAPHY;  Surgeon: Burnell Blanks, MD;  Location: [facility];  Service: Cardiovascular;  Laterality: N/A;     Allergies  Allergen Reactions  . Nitroglycerin     NOTE: Patient states that she can take the tablets but is not able to use the patches      Family History  Problem Relation Age of Onset  . CAD Mother 79  . Diabetes Sister   . Hypertension Sister   . CAD Brother      Social History Tamara Carpenter reports that she has been smoking cigarettes. She started smoking about 50 years ago. She has a 24.00 pack-year smoking history. She has never used smokeless tobacco. Tamara Carpenter reports no history of alcohol use.   Review of Systems CONSTITUTIONAL: No weight loss, fever, chills, weakness or fatigue.  HEENT: Eyes: No visual loss, blurred vision, double vision or yellow sclerae.No hearing loss, sneezing, congestion, runny nose or sore throat.  SKIN: No rash or itching.  CARDIOVASCULAR: per hpi RESPIRATORY: No shortness of breath, cough or sputum.  GASTROINTESTINAL: No anorexia, nausea, vomiting or diarrhea. No abdominal pain or blood.  GENITOURINARY: No burning on urination, no polyuria NEUROLOGICAL: No headache, dizziness, syncope, paralysis, ataxia, numbness or tingling in the extremities. No change in bowel or bladder control.  MUSCULOSKELETAL: No muscle, back pain, joint pain or stiffness.  LYMPHATICS: No enlarged nodes. No history of splenectomy.  PSYCHIATRIC: No history of depression or anxiety.  ENDOCRINOLOGIC: No reports of sweating, cold or heat intolerance. No polyuria or polydipsia.  Marland Kitchen   Physical Examination Today's Vitals   09/11/19 1309  BP: 130/72  Pulse: 79  SpO2: 95%  Weight: 196 lb 3.2 oz (89 kg)  Height: 5\' 7"  (1.702 m)   Body mass index is 30.73 kg/m.  Gen: resting comfortably, no acute distress HEENT: no scleral icterus, pupils equal  round and reactive, no palptable cervical adenopathy,  CV: RRR, no m/r/g, no jvd Resp: Clear to auscultation bilaterally GI: abdomen is soft, non-tender, non-distended, normal bowel sounds, no hepatosplenomegaly MSK: extremities are warm, no edema.  Skin: warm, no rash Neuro:  no focal deficits Psych: appropriate affect   Diagnostic Studies  12/2016 RHC/LHC  SVG graft was visualized by angiography.  Ost RCA to Prox RCA lesion, 100 %stenosed.  Prox Cx to Mid Cx lesion, 100 %stenosed.  SVG graft was visualized by angiography.  A STENT RESOLUTE ONYX 3.5X18 drug eluting stent was successfully placed.  Dist Graft lesion, 99 %stenosed.  Post intervention, there is  a 0% residual stenosis.  Prox Graft lesion, 99 %stenosed.  Post intervention, there is a 0% residual stenosis.  A STENT RESOLUTE ONYX 3.5X12 drug eluting stent was successfully placed.  Prox LAD to Mid LAD lesion, 99 %stenosed.  LIMA graft was visualized by angiography.  LV end diastolic pressure is normal.  Mid LAD to Dist LAD lesion, 30 %stenosed.  1. Severe triple vessel CAD s/p 3V CABG with 3/3 patent bypass grafts 2. The mid LAD has high grade stenosis. The mid and distal LAD fills from the patent LIMA graft 3. The Circumflex is occluded in the mid segment. The OM fills from the patent vein graft. The proximal body of the vein graft has a 99% stenosis.  4. Successful PTCA/DES x 2 proximal body of SVG to OM 5. The RCA is proximally occluded. The vein graft to the distal RCA has a 99% stenosis.  6. Successful PTCA/DES x 1 distal body of SVG to RCA  Recommendations: DAPT with ASA and Brilinta for one year. Continue statin and beta blocker  12/2016 echo Study Conclusions  - Left ventricle: The cavity size was normal. Wall thickness was increased in a pattern of moderate LVH. Systolic function was severely reduced. The estimated ejection fraction was in the range of 25% to 30%. Doppler parameters  are consistent with restrictive physiology, indicative of decreased left ventricular diastolic compliance and/or increased left atrial pressure. Doppler parameters are consistent with high ventricular filling pressure. - Aortic valve: Valve area (VTI): 2.99 cm^2. Valve area (Vmax): 2.53 cm^2. Valve area (Vmean): 2.95 cm^2. - Mitral valve: There was mild regurgitation. - Left atrium: The atrium was severely dilated. - Right ventricle: Systolic function was mildly reduced. - Right atrium: The atrium was mildly dilated. - Inferior vena cava: The vessel was dilated. The respirophasic diameter changes were blunted (<50%), consistent with elevated central venous pressure. - Technically difficult study. Consider limited contrast study to more precisely describe her LV function and wall motion.  10/2017 echo Study Conclusions  - Left ventricle: Images are limited - suggest Definity contrast study for further evaluation. The cavity size was normal. There was severe focal basal hypertrophy of the septum. The estimated ejection fraction was approximately 35%. There is hypokinesis of the mid-apicalanteroseptal and apical myocardium. Doppler parameters are consistent with abnormal left ventricular relaxation (grade 1 diastolic dysfunction). - Mitral valve: Mildly calcified annulus. There was mild regurgitation. - Right atrium: Central venous pressure (est): 3 mm Hg. - Atrial septum: No defect or patent foramen ovale was identified. - Tricuspid valve: There was trivial regurgitation. - Pulmonary arteries: Systolic pressure could not be accurately estimated. - Pericardium, extracardiac: A prominent pericardial fat pad was present.  Jan 2020 echo 1. The left ventricle has normal systolic function of 0000000. The cavity size is normal. There is not assessed left ventricular wall thickness. Echo evidence of not assessed diastolic filling patterns. 2. Not  assessed left atrial size. 3. Not assessed right atrial size. 4. The mitral valve not assessed. Regurgitation not assessed. 5. Tricuspid regurgitation not assessed. 6. The aortic valve not assessed. Aortic valve regurgitation not assessed. 7. Not assessed.   Assessment and Plan    1. CAD - AKI on ACE-I previously, has not been on - no symtpoms, continue current meds   2. Chronic systolic HF -repeat echo shows LVEF has normalized - no signs of fluid overload, she has actually lost quite a bit of weight - continue current meds, stop imdur due to low bps  3. HTN -  at goal. She stopped imdur due to low bp's, we will d/c all together.    4. Hyperlipidemia - continue statin   5. SOB - obtain PFTs, long smoking history - I don't think symptoms are cardiac at this time - some deconditoning also playing a role, limited due to chronic back pain.   F/u 6 months  Arnoldo Lenis, M.D..

## 2019-09-11 NOTE — Patient Instructions (Signed)
Your physician wants you to follow-up in: [place] will receive a reminder letter in the mail two months in advance. If you don't receive a letter, please call our office to schedule the follow-up appointment.  Your physician has recommended you make the following change in your medication:   STOP IMDUR  Your physician has recommended that you have a pulmonary function test. Pulmonary Function Tests are a group of tests that measure how well air moves in and out of your lungs.  Thank you for choosing [facility]!!

## 2019-09-27 ENCOUNTER — Other Ambulatory Visit: Payer: Self-pay

## 2019-09-27 ENCOUNTER — Other Ambulatory Visit (HOSPITAL_COMMUNITY)
Admission: RE | Admit: 2019-09-27 | Discharge: 2019-09-27 | Disposition: A | Discharge status: Home / Self Care | Payer: Medicare Other | Source: Ambulatory Visit | Attending: Cardiology | Admitting: Cardiology

## 2019-10-01 ENCOUNTER — Ambulatory Visit (HOSPITAL_COMMUNITY): Payer: Medicare Other

## 2020-02-28 DEATH — deceased

## 2020-03-12 ENCOUNTER — Ambulatory Visit: Payer: Medicare Other | Admitting: Cardiology

## 2020-03-12 NOTE — Progress Notes (Deleted)
Clinical Summary Tamara Carpenter is a 69 y.o.female  seen today for follow up of the following medical problems   1. CAD/Chronic systolic heart failure - previously followed by Dr Dossie Der in [place], [place] - history of prior CABG in 2001 - admitted 12/2016 with signs of CHF, elevated trop to 0.54. - 12/2016 echo LVEF 25-30%, abnormal diastolic function, mild RV dysfunction -12/2016 RHC/LHC: severe 3 vessel disease. 99% stenosis in SVG to OM, received DES x 2 and DES to SVG to RCA.  10/2017 echo LVEF 10%, grade I diastolic dysfunction   - AKI 01/2018 with Cr up to 1.58. ACE-I stopped at that time. Started on hydral.imdur instead.Note had some renal artery stenosis by prior CT, unclear if related.   Jan 2020 echo: LVEF 27-25%, grade I diastolic dysfunction - Jan 2020 echo with contrast : LVEF 55-60%  - no recent chest pain - sedentary due to chronic back pain, noticing some DOE - no exertional chest pain. Reports some LE edema - takes lasix 20mg  prn about 3 days a week.  - she stopped imdur due to low bp's. She has lost some weight.    2. HTN -stopped imdur due to low bp's - prior AKI on ACE-I   3. PAD - prior interventions - followed by vascular  4. AAA - incidental finding during 11/2017 ER visit.  - 11/2017 CT with 4.6 cm AAA, followed by vascular.   5. Renal artery stenosis - noted by 11/2017 CTA A/P  6. Current smoker - smoker x 50 years.  - has not had formal PFTs - recent SOB, coughing, wheezing.  Past Medical History:  Diagnosis Date  . Back pain   . 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 CAD (coronary artery disease)    a. CABG in 2001 b. cath 12/29/16 - Successful PTCA/DES x 2 proximal body of SVG to OM & PTCA/DES x 1 distal body of SVG to RCA.   . Diabetes mellitus without complication ([place])   . Hypertension   . PVD (peripheral vascular disease) ([place])    has stents in both legs     Allergies  Allergen  Reactions  . Nitroglycerin     NOTE: Patient states that she can take the tablets but is not able to use the patches     Current Outpatient Medications  Medication Sig Dispense Refill  . aspirin EC 81 MG tablet Take 81 mg by mouth daily.    . carvedilol (COREG) 25 MG tablet Take 1 tablet (25 mg total) by mouth 2 (two) times daily. 180 tablet 1  . furosemide (LASIX) 20 MG tablet Take 1 tablet (20 mg total) by mouth daily as needed. 90 tablet 1  . hydrALAZINE (APRESOLINE) 25 MG tablet Take 1 tablet (25 mg total) by mouth 3 (three) times daily. 270 tablet 1  . insulin degludec (TRESIBA FLEXTOUCH) 100 UNIT/ML SOPN FlexTouch Pen Inject 20 Units into the skin daily at 10 pm.     . Multiple Vitamin (MULTIVITAMIN) tablet Take 1 tablet by mouth daily.    . nitroGLYCERIN (NITROSTAT) 0.4 MG SL tablet Place 1 tablet (0.4 mg total) under the tongue every 5 (five) minutes as needed for chest pain. 25 tablet 3  . pravastatin (PRAVACHOL) 80 MG tablet Take 1 tablet (80 mg total) by mouth every evening. 90 tablet 1  . traMADol (ULTRAM) 50 MG tablet Take 50 mg by mouth every 6 (six) hours as  needed for moderate pain or severe pain.    . Vitamin D, Ergocalciferol, (DRISDOL) 50000 units CAPS capsule Take 1 capsule by mouth once a week.  1   No current facility-administered medications for this visit.     Past Surgical History:  Procedure Laterality Date  . cardiac bypass  2001   3v  . CORONARY STENT INTERVENTION N/A 12/29/2016   Procedure: CORONARY STENT INTERVENTION;  Surgeon: Burnell Blanks, MD;  Location: [facility];  Service: Cardiovascular;  Laterality: N/A;  . multiple toe amputations  2010-2017   3 toes on the right foot, 1 on the left  . nose removal  2004   basal cell carcinoma  . RIGHT/LEFT HEART CATH AND CORONARY/GRAFT ANGIOGRAPHY N/A 12/29/2016   Procedure: RIGHT/LEFT HEART CATH AND CORONARY/GRAFT ANGIOGRAPHY;  Surgeon: Burnell Blanks, MD;  Location: [facility];   Service: Cardiovascular;  Laterality: N/A;     Allergies  Allergen Reactions  . Nitroglycerin     NOTE: Patient states that she can take the tablets but is not able to use the patches      Family History  Problem Relation Age of Onset  . CAD Mother 2  . Diabetes Sister   . Hypertension Sister   . CAD Brother      Social History Tamara Carpenter reports that she has been smoking cigarettes. She started smoking about 51 years ago. She has a 24.00 pack-year smoking history. She has never used smokeless tobacco. Tamara Carpenter reports no history of alcohol use.   Review of Systems CONSTITUTIONAL: No weight loss, fever, chills, weakness or fatigue.  HEENT: Eyes: No visual loss, blurred vision, double vision or yellow sclerae.No hearing loss, sneezing, congestion, runny nose or sore throat.  SKIN: No rash or itching.  CARDIOVASCULAR:  RESPIRATORY: No shortness of breath, cough or sputum.  GASTROINTESTINAL: No anorexia, nausea, vomiting or diarrhea. No abdominal pain or blood.  GENITOURINARY: No burning on urination, no polyuria NEUROLOGICAL: No headache, dizziness, syncope, paralysis, ataxia, numbness or tingling in the extremities. No change in bowel or bladder control.  MUSCULOSKELETAL: No muscle, back pain, joint pain or stiffness.  LYMPHATICS: No enlarged nodes. No history of splenectomy.  PSYCHIATRIC: No history of depression or anxiety.  ENDOCRINOLOGIC: No reports of sweating, cold or heat intolerance. No polyuria or polydipsia.  Marland Kitchen   Physical Examination There were no vitals filed for this visit. There were no vitals filed for this visit.  Gen: resting comfortably, no acute distress HEENT: no scleral icterus, pupils equal round and reactive, no palptable cervical adenopathy,  CV Resp: Clear to auscultation bilaterally GI: abdomen is soft, non-tender, non-distended, normal bowel sounds, no hepatosplenomegaly MSK: extremities are warm, no edema.  Skin: warm, no rash Neuro:   no focal deficits Psych: appropriate affect   Diagnostic Studies 12/2016 RHC/LHC  SVG graft was visualized by angiography.  Ost RCA to Prox RCA lesion, 100 %stenosed.  Prox Cx to Mid Cx lesion, 100 %stenosed.  SVG graft was visualized by angiography.  A STENT RESOLUTE ONYX 3.5X18 drug eluting stent was successfully placed.  Dist Graft lesion, 99 %stenosed.  Post intervention, there is a 0% residual stenosis.  Prox Graft lesion, 99 %stenosed.  Post intervention, there is a 0% residual stenosis.  A STENT RESOLUTE ONYX 3.5X12 drug eluting stent was successfully placed.  Prox LAD to Mid LAD lesion, 99 %stenosed.  LIMA graft was visualized by angiography.  LV end diastolic pressure is normal.  Mid LAD to [facility]  LAD lesion, 30 %stenosed.  1. Severe triple vessel CAD s/p 3V CABG with 3/3 patent bypass grafts 2. The mid LAD has high grade stenosis. The mid and distal LAD fills from the patent LIMA graft 3. The Circumflex is occluded in the mid segment. The OM fills from the patent vein graft. The proximal body of the vein graft has a 99% stenosis.  4. Successful PTCA/DES x 2 proximal body of SVG to OM 5. The RCA is proximally occluded. The vein graft to the distal RCA has a 99% stenosis.  6. Successful PTCA/DES x 1 distal body of SVG to RCA  Recommendations: DAPT with ASA and Brilinta for one year. Continue statin and beta blocker  12/2016 echo Study Conclusions  - Left ventricle: The cavity size was normal. Wall thickness was increased in a pattern of moderate LVH. Systolic function was severely reduced. The estimated ejection fraction was in the range of 25% to 30%. Doppler parameters are consistent with restrictive physiology, indicative of decreased left ventricular diastolic compliance and/or increased left atrial pressure. Doppler parameters are consistent with high ventricular filling pressure. - Aortic valve: Valve area (VTI): 2.99 cm^2. Valve area  (Vmax): 2.53 cm^2. Valve area (Vmean): 2.95 cm^2. - Mitral valve: There was mild regurgitation. - Left atrium: The atrium was severely dilated. - Right ventricle: Systolic function was mildly reduced. - Right atrium: The atrium was mildly dilated. - Inferior vena cava: The vessel was dilated. The respirophasic diameter changes were blunted (<50%), consistent with elevated central venous pressure. - Technically difficult study. Consider limited contrast study to more precisely describe her LV function and wall motion.  10/2017 echo Study Conclusions  - Left ventricle: Images are limited - suggest Definity contrast study for further evaluation. The cavity size was normal. There was severe focal basal hypertrophy of the septum. The estimated ejection fraction was approximately 35%. There is hypokinesis of the mid-apicalanteroseptal and apical myocardium. Doppler parameters are consistent with abnormal left ventricular relaxation (grade 1 diastolic dysfunction). - Mitral valve: Mildly calcified annulus. There was mild regurgitation. - Right atrium: Central venous pressure (est): 3 mm Hg. - Atrial septum: No defect or patent foramen ovale was identified. - Tricuspid valve: There was trivial regurgitation. - Pulmonary arteries: Systolic pressure could not be accurately estimated. - Pericardium, extracardiac: A prominent pericardial fat pad was present.  Jan 2020 echo 1. The left ventricle has normal systolic function of 00-93%. The cavity size is normal. There is not assessed left ventricular wall thickness. Echo evidence of not assessed diastolic filling patterns. 2. Not assessed left atrial size. 3. Not assessed right atrial size. 4. The mitral valve not assessed. Regurgitation not assessed. 5. Tricuspid regurgitation not assessed. 6. The aortic valve not assessed. Aortic valve regurgitation not assessed. 7. Not assessed.    Assessment and  Plan  1. CAD - AKI on ACE-I previously, has not been on - no symtpoms, continue current meds   2. Chronic systolic HF -repeat echoshows LVEF has normalized - no signs of fluid overload, she has actually lost quite a bit of weight - continue current meds, stop imdur due to low bps  3. HTN - at goal. She stopped imdur due to low bp's, we will d/c all together.    4. Hyperlipidemia - continue statin   5. SOB - obtain PFTs, long smoking history - I don't think symptoms are cardiac at this time - some deconditoning also playing a role, limited due to chronic back pain.  Arnoldo Lenis, M.D., F.A.C.C.
